# Patient Record
Sex: Male | Born: 1995
Health system: Southern US, Community
[De-identification: ages and names within clinical notes are randomized; demographics above are authoritative.]

## PROBLEM LIST (undated history)

## (undated) DIAGNOSIS — Z8489 Family history of other specified conditions: Secondary | ICD-10-CM

## (undated) DIAGNOSIS — I10 Essential (primary) hypertension: Secondary | ICD-10-CM

## (undated) HISTORY — PX: TONSILLECTOMY: SUR1361

## (undated) HISTORY — DX: Essential (primary) hypertension: I10

---
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
## 2017-08-20 ENCOUNTER — Encounter: Payer: Self-pay | Admitting: Family Medicine

## 2017-08-20 ENCOUNTER — Ambulatory Visit (INDEPENDENT_AMBULATORY_CARE_PROVIDER_SITE_OTHER): Payer: BLUE CROSS/BLUE SHIELD | Admitting: Family Medicine

## 2017-08-20 VITALS — BP 142/78 | HR 84 | Temp 98.4°F | Ht 71.0 in | Wt 247.6 lb

## 2017-08-20 DIAGNOSIS — I10 Essential (primary) hypertension: Secondary | ICD-10-CM

## 2017-08-20 DIAGNOSIS — Z13220 Encounter for screening for lipoid disorders: Secondary | ICD-10-CM

## 2017-08-20 DIAGNOSIS — Z0000 Encounter for general adult medical examination without abnormal findings: Secondary | ICD-10-CM | POA: Diagnosis not present

## 2017-08-20 LAB — LIPID PANEL
Cholesterol: 158 mg/dL (ref 0–200)
HDL: 31.2 mg/dL — ABNORMAL LOW (ref >39.00)
NonHDL: 126.76
Total CHOL/HDL Ratio: 5
Triglycerides: 313 mg/dL — ABNORMAL HIGH (ref 0.0–149.0)
VLDL: 62.6 mg/dL — ABNORMAL HIGH (ref 0.0–40.0)

## 2017-08-20 LAB — CBC WITH DIFFERENTIAL/PLATELET
Basophils Absolute: 0.2 10*3/uL — ABNORMAL HIGH (ref 0.0–0.1)
Basophils Relative: 2 % (ref 0.0–3.0)
Eosinophils Absolute: 0.5 10*3/uL (ref 0.0–0.7)
Eosinophils Relative: 5.8 % — ABNORMAL HIGH (ref 0.0–5.0)
HCT: 46.8 % (ref 39.0–52.0)
Hemoglobin: 15.9 g/dL (ref 13.0–17.0)
Lymphocytes Relative: 35.9 % (ref 12.0–46.0)
Lymphs Abs: 3.2 10*3/uL (ref 0.7–4.0)
MCHC: 34.1 g/dL (ref 30.0–36.0)
MCV: 91.3 fl (ref 78.0–100.0)
Monocytes Absolute: 0.7 10*3/uL (ref 0.1–1.0)
Monocytes Relative: 7.8 % (ref 3.0–12.0)
Neutro Abs: 4.3 10*3/uL (ref 1.4–7.7)
Neutrophils Relative %: 48.5 % (ref 43.0–77.0)
Platelets: 489 10*3/uL — ABNORMAL HIGH (ref 150.0–400.0)
RBC: 5.12 Mil/uL (ref 4.22–5.81)
RDW: 13.2 % (ref 11.5–14.6)
WBC: 8.9 10*3/uL (ref 4.5–10.5)

## 2017-08-20 LAB — COMPREHENSIVE METABOLIC PANEL
ALT: 40 U/L (ref 0–53)
AST: 25 U/L (ref 0–37)
Albumin: 4.8 g/dL (ref 3.5–5.2)
Alkaline Phosphatase: 81 U/L (ref 39–117)
BUN: 15 mg/dL (ref 6–23)
CO2: 28 mEq/L (ref 19–32)
Calcium: 10.4 mg/dL (ref 8.4–10.5)
Chloride: 103 mEq/L (ref 96–112)
Creatinine, Ser: 0.99 mg/dL (ref 0.40–1.50)
GFR: 102.43 mL/min (ref >60.00)
Glucose, Bld: 83 mg/dL (ref 70–99)
Potassium: 4.4 mEq/L (ref 3.5–5.1)
Sodium: 139 mEq/L (ref 135–145)
Total Bilirubin: 0.5 mg/dL (ref 0.2–1.2)
Total Protein: 7.1 g/dL (ref 6.0–8.3)

## 2017-08-20 LAB — LDL CHOLESTEROL, DIRECT: Direct LDL: 104 mg/dL

## 2017-08-20 LAB — HEMOGLOBIN A1C: Hgb A1c MFr Bld: 5.4 % (ref 4.6–6.5)

## 2017-08-20 LAB — TSH: TSH: 1.04 u[IU]/mL (ref 0.35–5.50)

## 2017-08-20 NOTE — Progress Notes (Signed)
Subjective:    Christian Villegas is a 22 y.o. male who presents today for his Complete Annual Exam.   No current outpatient medications on file.  Health Maintenance Due  Topic Date Due  . HIV Screening  08/18/2012  . INFLUENZA VACCINE  08/14/2017   PMHx, SurgHx, SocialHx, Medications, and Allergies were reviewed in the Visit Navigator and updated as appropriate.   Past Medical History:  Diagnosis Date  . Family history of adverse reaction to anesthesia    Father: Difficult to awaken.  . Hypertension      Past Surgical History:  Procedure Laterality Date  . OPEN REDUCTION INTERNAL FIXATION (ORIF) METACARPAL Left 08/12/2015   Procedure: OPEN REDUCTION INTERNAL FIXATION (ORIF) LEFT 5th METACARPAL PHALANGEAL FRACTURE;  Surgeon: Christian Kaufman, MD;  Location: [facility];  Service: Orthopedics;  Laterality: Left;  . TONSILLECTOMY       Family History  Problem Relation Age of Onset  . Hypertension Father   . Hypertension Paternal Uncle   . Asthma Maternal Grandmother   . Birth defects Maternal Grandmother   . Diabetes Maternal Grandmother   . Hyperlipidemia Maternal Grandmother   . Hypertension Maternal Grandmother   . Birth defects Maternal Grandfather   . Diabetes Maternal Grandfather   . Hyperlipidemia Maternal Grandfather   . Hypertension Maternal Grandfather   . Birth defects Paternal Grandmother   . Heart disease Paternal Grandfather   . Hypertension Paternal Grandfather     Social History   Tobacco Use  . Smoking status: Never Smoker  . Smokeless tobacco: Never Used  Substance Use Topics  . Alcohol use: Yes    Comment: Drinks rarely  . Drug use: No   Review of Systems:   Pertinent items are noted in the HPI. Otherwise, ROS is negative.  Objective:   Vitals:   08/20/17 0933  BP: (!) 142/78  Pulse: 84  Temp: 98.4 F (36.9 C)  SpO2: 98%   Body mass index is 34.53 kg/m.  General Appearance:  Alert, cooperative, no distress, appears stated age  Head:   Normocephalic, without obvious abnormality, atraumatic  Eyes:  PERRL, conjunctiva/corneas clear, EOM's intact, fundi benign, both eyes       Ears:  Normal TM's and external ear canals, both ears  Nose: Nares normal, septum midline, mucosa normal, no drainage    or sinus tenderness  Throat: Lips, mucosa, and tongue normal; teeth and gums normal  Neck: Supple, symmetrical, trachea midline, no adenopathy; thyroid:  No enlargement/tenderness/nodules; no carotit bruit or JVD  Back:   Symmetric, no curvature, ROM normal, no CVA tenderness  Lungs:   Clear to auscultation bilaterally, respirations unlabored  Chest wall:  No tenderness or deformity  Heart:  Regular rate and rhythm, S1 and S2 normal, no murmur, rub   or gallop  Abdomen:   Soft, non-tender, bowel sounds active all four quadrants, no masses, no organomegaly  Extremities: Extremities normal, atraumatic, no cyanosis or edema  Prostate: Not done.   Skin: Skin color, texture, turgor normal, no rashes or lesions  Lymph nodes: Cervical, supraclavicular, and axillary nodes normal  Neurologic: CNII-XII grossly intact. Normal strength, sensation and reflexes throughout   Assessment/Plan:   Christian Villegas was seen today for annual exam.  Diagnoses and all orders for this visit:  Routine physical examination  Morbid obesity ([name]) -     CBC with Differential/Platelet -     Comprehensive metabolic panel -     Hemoglobin A1c -     Lipid panel -  TSH  White coat syndrome with diagnosis of hypertension -     CBC with Differential/Platelet -     Comprehensive metabolic panel -     Hemoglobin A1c -     Lipid panel -     TSH  Screening for lipid disorders -     Lipid panel   Patient Counseling: [x]   Nutrition: Stressed importance of moderation in sodium/caffeine intake, saturated fat and cholesterol, caloric balance, sufficient intake of fresh fruits, vegetables, and fiber.  [x]   Stressed the importance of regular exercise.   []   Substance  Abuse: Discussed cessation/primary prevention of tobacco, alcohol, or other drug use; driving or other dangerous activities under the influence; availability of treatment for abuse.   [x]   Injury prevention: Discussed safety belts, safety helmets, smoke detector, smoking near bedding or upholstery.   []   Sexuality: Discussed sexually transmitted diseases, partner selection, use of condoms, avoidance of unintended pregnancy and contraceptive alternatives.   [x]   Dental health: Discussed importance of regular tooth brushing, flossing, and dental visits.  [x]   Health maintenance and immunizations reviewed. Please refer to Health maintenance section.    Christian Deutscher, DO [place]

## 2017-09-25 DIAGNOSIS — S83105A Unspecified dislocation of left knee, initial encounter: Secondary | ICD-10-CM | POA: Diagnosis not present

## 2017-09-25 DIAGNOSIS — Z87828 Personal history of other (healed) physical injury and trauma: Secondary | ICD-10-CM | POA: Diagnosis not present

## 2017-09-25 DIAGNOSIS — M25562 Pain in left knee: Secondary | ICD-10-CM | POA: Diagnosis not present

## 2017-09-25 DIAGNOSIS — S8992XA Unspecified injury of left lower leg, initial encounter: Secondary | ICD-10-CM | POA: Diagnosis not present

## 2017-09-25 DIAGNOSIS — X58XXXA Exposure to other specified factors, initial encounter: Secondary | ICD-10-CM | POA: Diagnosis not present

## 2017-09-25 DIAGNOSIS — M25462 Effusion, left knee: Secondary | ICD-10-CM | POA: Diagnosis not present

## 2017-09-26 DIAGNOSIS — S83015S Lateral dislocation of left patella, sequela: Secondary | ICD-10-CM | POA: Diagnosis not present

## 2017-09-30 DIAGNOSIS — M25462 Effusion, left knee: Secondary | ICD-10-CM | POA: Diagnosis not present

## 2017-10-03 DIAGNOSIS — S83015A Lateral dislocation of left patella, initial encounter: Secondary | ICD-10-CM | POA: Diagnosis not present

## 2017-10-24 DIAGNOSIS — S83015S Lateral dislocation of left patella, sequela: Secondary | ICD-10-CM | POA: Diagnosis not present

## 2017-12-10 DIAGNOSIS — S83015S Lateral dislocation of left patella, sequela: Secondary | ICD-10-CM | POA: Diagnosis not present

## 2017-12-10 DIAGNOSIS — S83015A Lateral dislocation of left patella, initial encounter: Secondary | ICD-10-CM | POA: Diagnosis not present

## 2017-12-14 HISTORY — PX: OTHER SURGICAL HISTORY: SHX169

## 2017-12-17 DIAGNOSIS — S83015S Lateral dislocation of left patella, sequela: Secondary | ICD-10-CM | POA: Diagnosis not present

## 2017-12-25 DIAGNOSIS — M2242 Chondromalacia patellae, left knee: Secondary | ICD-10-CM | POA: Diagnosis not present

## 2017-12-25 DIAGNOSIS — M65862 Other synovitis and tenosynovitis, left lower leg: Secondary | ICD-10-CM | POA: Diagnosis not present

## 2017-12-25 DIAGNOSIS — S838X2A Sprain of other specified parts of left knee, initial encounter: Secondary | ICD-10-CM | POA: Diagnosis not present

## 2017-12-25 DIAGNOSIS — S83015A Lateral dislocation of left patella, initial encounter: Secondary | ICD-10-CM | POA: Diagnosis not present

## 2017-12-25 DIAGNOSIS — T85698A Other mechanical complication of other specified internal prosthetic devices, implants and grafts, initial encounter: Secondary | ICD-10-CM | POA: Diagnosis not present

## 2017-12-25 DIAGNOSIS — M25362 Other instability, left knee: Secondary | ICD-10-CM | POA: Diagnosis not present

## 2017-12-25 DIAGNOSIS — M2202 Recurrent dislocation of patella, left knee: Secondary | ICD-10-CM | POA: Diagnosis not present

## 2017-12-25 DIAGNOSIS — Z9889 Other specified postprocedural states: Secondary | ICD-10-CM | POA: Diagnosis not present

## 2017-12-25 DIAGNOSIS — M6588 Other synovitis and tenosynovitis, other site: Secondary | ICD-10-CM | POA: Diagnosis not present

## 2018-01-02 DIAGNOSIS — S83015S Lateral dislocation of left patella, sequela: Secondary | ICD-10-CM | POA: Diagnosis not present

## 2018-01-16 DIAGNOSIS — S83015S Lateral dislocation of left patella, sequela: Secondary | ICD-10-CM | POA: Diagnosis not present

## 2018-01-16 DIAGNOSIS — S83015A Lateral dislocation of left patella, initial encounter: Secondary | ICD-10-CM | POA: Diagnosis not present

## 2018-02-06 DIAGNOSIS — S83015A Lateral dislocation of left patella, initial encounter: Secondary | ICD-10-CM | POA: Diagnosis not present

## 2018-02-06 DIAGNOSIS — S83015S Lateral dislocation of left patella, sequela: Secondary | ICD-10-CM | POA: Diagnosis not present

## 2018-08-04 NOTE — Progress Notes (Signed)
Christian Villegas is a 23 y.o. male is here for follow up.  History of Present Illness:   Hypertension This is a chronic problem. The current episode started more than 1 year ago. The problem has been waxing and waning since onset. The problem is uncontrolled. Pertinent negatives include no anxiety, blurred vision, chest pain, headaches, malaise/fatigue, palpitations, peripheral edema or shortness of breath. There are no associated agents to hypertension. Risk factors for coronary artery disease include male gender and family history. Past treatments include lifestyle changes. The current treatment provides mild improvement. Compliance problems include medication side effects (was on medication when he was younger but felt fatigued).  There is no history of kidney disease.   Health Maintenance Due  Topic Date Due  . HIV Screening  08/18/2012   Depression screen [facility] 2/9 08/05/2018 08/20/2017  Decreased Interest 0 0  Down, Depressed, Hopeless 0 0  PHQ - 2 Score 0 0  Altered sleeping 0 -  Tired, decreased energy 0 -  Change in appetite 0 -  Feeling bad or failure about yourself  0 -  Trouble concentrating 0 -  Moving slowly or fidgety/restless 0 -  Suicidal thoughts 0 -  PHQ-9 Score 0 -  Difficult doing work/chores Not difficult at all -   PMHx, SurgHx, SocialHx, FamHx, Medications, and Allergies were reviewed in the Visit Navigator and updated as appropriate.   Patient Active Problem List   Diagnosis Date Noted  . Recurrent dislocation of left patella 08/30/2016  . Chondromalacia patellae, left knee 08/30/2016   Social History   Tobacco Use  . Smoking status: Never Smoker  . Smokeless tobacco: Never Used  Substance Use Topics  . Alcohol use: Yes    Comment: Drinks rarely  . Drug use: No   Current Medications and Allergies   .  None  No Known Allergies   Review of Systems   Pertinent items are noted in the HPI. Otherwise, a complete ROS is negative.  Vitals   Vitals:     08/05/18 1602  BP: (!) 160/92  Pulse: 79  Temp: 98.8 F (37.1 C)  TempSrc: Oral  SpO2: 98%  Weight: 237 lb 4 oz (107.6 kg)  Height: 5\' 11"  (1.803 m)     Body mass index is 33.09 kg/m.  Physical Exam   Physical Exam Vitals signs and nursing note reviewed.  Constitutional:      General: He is not in acute distress.    Appearance: He is well-developed.  HENT:     Head: Normocephalic and atraumatic.     Right Ear: External ear normal.     Left Ear: External ear normal.     Nose: Nose normal.  Eyes:     Conjunctiva/sclera: Conjunctivae normal.     Pupils: Pupils are equal, round, and reactive to light.  Neck:     Musculoskeletal: Normal range of motion and neck supple.  Cardiovascular:     Rate and Rhythm: Normal rate and regular rhythm.     Heart sounds: Normal heart sounds.  Pulmonary:     Effort: Pulmonary effort is normal.     Breath sounds: Normal breath sounds.  Abdominal:     General: Bowel sounds are normal.     Palpations: Abdomen is soft.  Musculoskeletal: Normal range of motion.  Skin:    General: Skin is warm and dry.  Neurological:     Mental Status: He is alert and oriented to person, place, and time.  Psychiatric:  Behavior: Behavior normal.        Thought Content: Thought content normal.        Judgment: Judgment normal.    Results for orders placed or performed in visit on 08/20/17  CBC with Differential/Platelet  Result Value Ref Range   WBC 8.9 4.5 - 10.5 K/uL   RBC 5.12 4.22 - 5.81 Mil/uL   Hemoglobin 15.9 13.0 - 17.0 g/dL   HCT 46.8 39.0 - 52.0 %   MCV 91.3 78.0 - 100.0 fl   MCHC 34.1 30.0 - 36.0 g/dL   RDW 13.2 11.5 - 14.6 %   Platelets 489.0 (H) 150.0 - 400.0 K/uL   Neutrophils Relative % 48.5 43.0 - 77.0 %   Lymphocytes Relative 35.9 12.0 - 46.0 %   Monocytes Relative 7.8 3.0 - 12.0 %   Eosinophils Relative 5.8 (H) 0.0 - 5.0 %   Basophils Relative 2.0 0.0 - 3.0 %   Neutro Abs 4.3 1.4 - 7.7 K/uL   Lymphs Abs 3.2 0.7 - 4.0  K/uL   Monocytes Absolute 0.7 0.1 - 1.0 K/uL   Eosinophils Absolute 0.5 0.0 - 0.7 K/uL   Basophils Absolute 0.2 (H) 0.0 - 0.1 K/uL  Comprehensive metabolic panel  Result Value Ref Range   Sodium 139 135 - 145 mEq/L   Potassium 4.4 3.5 - 5.1 mEq/L   Chloride 103 96 - 112 mEq/L   CO2 28 19 - 32 mEq/L   Glucose, Bld 83 70 - 99 mg/dL   BUN 15 6 - 23 mg/dL   Creatinine, Ser 0.99 0.40 - 1.50 mg/dL   Total Bilirubin 0.5 0.2 - 1.2 mg/dL   Alkaline Phosphatase 81 39 - 117 U/L   AST 25 0 - 37 U/L   ALT 40 0 - 53 U/L   Total Protein 7.1 6.0 - 8.3 g/dL   Albumin 4.8 3.5 - 5.2 g/dL   Calcium 10.4 8.4 - 10.5 mg/dL   GFR 102.43 >60.00 mL/min  Hemoglobin A1c  Result Value Ref Range   Hgb A1c MFr Bld 5.4 4.6 - 6.5 %  Lipid panel  Result Value Ref Range   Cholesterol 158 0 - 200 mg/dL   Triglycerides 313.0 (H) 0.0 - 149.0 mg/dL   HDL 31.20 (L) >39.00 mg/dL   VLDL 62.6 (H) 0.0 - 40.0 mg/dL   Total CHOL/HDL Ratio 5    NonHDL 126.76   TSH  Result Value Ref Range   TSH 1.04 0.35 - 5.50 uIU/mL  LDL cholesterol, direct  Result Value Ref Range   Direct LDL 104.0 mg/dL   Assessment and Plan   [name] was seen today for hypertension.  Diagnoses and all orders for this visit:  Essential hypertension -     losartan (COZAAR) 50 MG tablet; Take 1 tablet (50 mg total) by mouth daily. -     Ambulatory referral to Sleep Studies -     CBC with Differential/Platelet -     TSH -     Magnesium  Sleep-disordered breathing -     Ambulatory referral to Sleep Studies  Mixed hyperlipidemia -     Comprehensive metabolic panel  Need for diphtheria-tetanus-pertussis (Tdap) vaccine -     Tdap vaccine greater than or equal to 7yo IM  Screening for HIV (human immunodeficiency virus) -     HIV Antibody (routine testing w rflx)   . Orders and follow up as documented in [place], reviewed diet, exercise and weight control, cardiovascular risk and specific lipid/LDL goals reviewed, reviewed  medications  and side effects in detail.  . Reviewed expectations re: course of current medical issues. . Outlined signs and symptoms indicating need for more acute intervention. . Patient verbalized understanding and all questions were answered. . Patient received an After Visit Summary.  Briscoe Deutscher, DO [place], [facility] 08/06/2018  Records requested if needed. Time spent: 25 minutes, of which >50% was spent in obtaining information about his symptoms, reviewing his previous labs, evaluations, and treatments, counseling him about his condition (please see the discussed topics above), and developing a plan to further investigate it; he had a number of questions which I addressed.

## 2018-08-05 ENCOUNTER — Ambulatory Visit (INDEPENDENT_AMBULATORY_CARE_PROVIDER_SITE_OTHER): Payer: BC Managed Care – PPO | Admitting: Family Medicine

## 2018-08-05 ENCOUNTER — Encounter: Payer: Self-pay | Admitting: Family Medicine

## 2018-08-05 ENCOUNTER — Other Ambulatory Visit: Payer: Self-pay

## 2018-08-05 VITALS — BP 160/92 | HR 79 | Temp 98.8°F | Ht 71.0 in | Wt 237.2 lb

## 2018-08-05 DIAGNOSIS — Z23 Encounter for immunization: Secondary | ICD-10-CM | POA: Diagnosis not present

## 2018-08-05 DIAGNOSIS — G4730 Sleep apnea, unspecified: Secondary | ICD-10-CM | POA: Diagnosis not present

## 2018-08-05 DIAGNOSIS — Z114 Encounter for screening for human immunodeficiency virus [HIV]: Secondary | ICD-10-CM

## 2018-08-05 DIAGNOSIS — E782 Mixed hyperlipidemia: Secondary | ICD-10-CM

## 2018-08-05 DIAGNOSIS — I10 Essential (primary) hypertension: Secondary | ICD-10-CM | POA: Diagnosis not present

## 2018-08-05 MED ORDER — LOSARTAN POTASSIUM 50 MG PO TABS: 50.0000 mg | ORAL_TABLET | Freq: Every day | ORAL | 3 refills | Status: DC

## 2018-08-06 LAB — COMPREHENSIVE METABOLIC PANEL
ALT: 24 U/L (ref 0–53)
AST: 17 U/L (ref 0–37)
Albumin: 5.2 g/dL (ref 3.5–5.2)
Alkaline Phosphatase: 65 U/L (ref 39–117)
BUN: 18 mg/dL (ref 6–23)
CO2: 29 mEq/L (ref 19–32)
Calcium: 10.5 mg/dL (ref 8.4–10.5)
Chloride: 101 mEq/L (ref 96–112)
Creatinine, Ser: 1.04 mg/dL (ref 0.40–1.50)
GFR: 90.18 mL/min (ref >60.00)
Glucose, Bld: 92 mg/dL (ref 70–99)
Potassium: 4.3 mEq/L (ref 3.5–5.1)
Sodium: 139 mEq/L (ref 135–145)
Total Bilirubin: 0.5 mg/dL (ref 0.2–1.2)
Total Protein: 7.4 g/dL (ref 6.0–8.3)

## 2018-08-06 LAB — TSH: TSH: 0.9 u[IU]/mL (ref 0.35–5.50)

## 2018-08-06 LAB — CBC WITH DIFFERENTIAL/PLATELET
Basophils Absolute: 0 10*3/uL (ref 0.0–0.1)
Basophils Relative: 0.5 % (ref 0.0–3.0)
Eosinophils Absolute: 0.6 10*3/uL (ref 0.0–0.7)
Eosinophils Relative: 6.8 % — ABNORMAL HIGH (ref 0.0–5.0)
HCT: 48.2 % (ref 39.0–52.0)
Hemoglobin: 15.9 g/dL (ref 13.0–17.0)
Lymphocytes Relative: 34.9 % (ref 12.0–46.0)
Lymphs Abs: 3 10*3/uL (ref 0.7–4.0)
MCHC: 33 g/dL (ref 30.0–36.0)
MCV: 92.6 fl (ref 78.0–100.0)
Monocytes Absolute: 0.5 10*3/uL (ref 0.1–1.0)
Monocytes Relative: 6.2 % (ref 3.0–12.0)
Neutro Abs: 4.4 10*3/uL (ref 1.4–7.7)
Neutrophils Relative %: 51.6 % (ref 43.0–77.0)
Platelets: 444 10*3/uL — ABNORMAL HIGH (ref 150.0–400.0)
RBC: 5.21 Mil/uL (ref 4.22–5.81)
RDW: 13 % (ref 11.5–14.6)
WBC: 8.6 10*3/uL (ref 4.5–10.5)

## 2018-08-06 LAB — HIV ANTIBODY (ROUTINE TESTING W REFLEX): HIV 1&2 Ab, 4th Generation: NONREACTIVE

## 2018-08-06 LAB — MAGNESIUM: Magnesium: 2 mg/dL (ref 1.5–2.5)

## 2018-08-13 ENCOUNTER — Ambulatory Visit (INDEPENDENT_AMBULATORY_CARE_PROVIDER_SITE_OTHER): Payer: BC Managed Care – PPO | Admitting: Neurology

## 2018-08-13 ENCOUNTER — Other Ambulatory Visit: Payer: Self-pay

## 2018-08-13 ENCOUNTER — Encounter: Payer: Self-pay | Admitting: Neurology

## 2018-08-13 VITALS — BP 140/73 | HR 70 | Temp 98.2°F | Ht 71.0 in | Wt 241.0 lb

## 2018-08-13 DIAGNOSIS — G4719 Other hypersomnia: Secondary | ICD-10-CM

## 2018-08-13 DIAGNOSIS — G478 Other sleep disorders: Secondary | ICD-10-CM

## 2018-08-13 DIAGNOSIS — I10 Essential (primary) hypertension: Secondary | ICD-10-CM

## 2018-08-13 DIAGNOSIS — R0683 Snoring: Secondary | ICD-10-CM

## 2018-08-13 NOTE — Progress Notes (Signed)
SLEEP MEDICINE CLINIC    Provider:  Larey Seat, MD  Primary Care Physician:  Briscoe Deutscher, [address] [place] [address]     Referring Provider: Briscoe Deutscher, [address] [place],  [place] [address]          Chief Complaint according to patient   Patient presents with:    . New Patient (Initial Visit)           HISTORY OF PRESENT ILLNESS:  Christian Villegas is a 22 y.o. year old right hand dominant  Single  Other or two or more races male patient seen here  on 08/13/2018 from Dr Juleen China for a sleep consultation.  Chief concern according to patient : " my Doc wonders if my high blood pressures could be related to sleep apnea. I was diagnosed with HTN in middle school."    I have the pleasure of seeing Christian Villegas today, a right-handed Other or two or more races male with a possible sleep disorder.  He  has a  has a past medical history of Family history of adverse reaction to anesthesia and Hypertension..  A renal US was reportedly negative for renal artery stenosis. His mother is not sure he ever had this [place] !    Sleep relevant medical history:  Nocturia/ Enuresis: none. No sleep walking, Night terrors- No other Parasomnia- Tonsillectomy at elementary school age , No trauma , ENT or cervical spine surgery. He denies allergic rhinitis, bronchitis, no asthma.     Family medical /sleep history: both grandparents on maternal side on CPAP with OSA,  2 maternal aunts. Parental grandparents have passed due to heart disease, CAD in their 63. Maternal grandparents living with high BP, cholesterol, cancer . MGF 72 and MGM 69.    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 His brother works there , too.   Patient is lives in a household with 6 persons, both parents , twin bothers, younger sister , baby brother. Family status is single.   Pets are not present. Tobacco use: none .  ETOH use; socially  - not now,  Caffeine intake in form of Coffee(  None ) Soda( 1/ week ) Tea ( decaffeinated ) or energy drinks. Regular exercise in form of jogging, work out and [employer].    Hobbies :Lacrosse.    Sleep habits are as follows: The patient's dinner time is between 5-9 PM.  The patient goes to bed at 11 PM and he falls asleep easily , continues to sleep for 7-8  Hours.   The preferred sleep position is prone  with the support of 1 pillow.  Dreams are reportedly frequent/vivid. Not prone to night mares.  7  AM is the usual rise time. The patient wakes up with an alarm.  Bedroom is cool, but he sleeps with a Tv on.  Hereports not feeling refreshed or restored in AM, with symptoms such as dry mouth, morning headaches in some cases, and residual fatigue.  Naps are not taken .    Review of Systems: Out of a complete 14 system review, the patient complains of only the following symptoms, and all other reviewed systems are negative.:  Non restorative sleep, no naps, HTN , snoring   How likely are you to doze in the following situations: 0 = not likely, 1 = slight chance, 2 = moderate chance, 3 = high chance  Sitting and Reading? Watching Television? Sitting inactive in a public place (theater or meeting)? As a passenger in a car for an hour without a break? Lying down in the afternoon when circumstances permit? Sitting and talking to someone? Sitting quietly after lunch without alcohol? In a car, while stopped for a few minutes in traffic?   Total = 13/ 24 points   FSS endorsed at 29/ 63 points.   Social History   Socioeconomic History  . Marital status: Single    Spouse name: Not on file  . Number of children: Not on file  . Years of education: Not on file  . Highest education level: Not on file  Occupational History  . Not on file  Social Needs  . Financial resource strain: Not on file  . Food insecurity    Worry: Not on file    Inability: Not on file  . Transportation needs     Medical: Not on file    Non-medical: Not on file  Tobacco Use  . Smoking status: Never Smoker  . Smokeless tobacco: Never Used  Substance and Sexual Activity  . Alcohol use: Yes    Comment: Drinks rarely  . Drug use: No  . Sexual activity: Never  Lifestyle  . Physical activity    Days per week: Not on file    Minutes per session: Not on file  . Stress: Not on file  Relationships  . Social Herbalist on phone: Not on file    Gets together: Not on file    Attends religious service: Not on file    Active member of club or organization: Not on file    Attends meetings of clubs or organizations: Not on file    Relationship status: Not on file  Other Topics Concern  . Not on file  Social History Narrative  . Not on file    Family History  Problem Relation Age of Onset  . Hypertension Father   . Hypertension Paternal Uncle   . Asthma Maternal Grandmother   . Birth defects Maternal Grandmother   . Diabetes Maternal Grandmother   . Hyperlipidemia Maternal Grandmother   . Hypertension Maternal Grandmother   . Birth defects Maternal Grandfather   . Diabetes Maternal Grandfather   . Hyperlipidemia Maternal Grandfather   . Hypertension Maternal Grandfather   . Birth defects Paternal Grandmother   . Heart disease Paternal Grandfather   . Hypertension Paternal Grandfather     Past Medical History:  Diagnosis Date  . Family history of adverse reaction to anesthesia    Father: Difficult to awaken.  . Hypertension     Past Surgical History:  Procedure Laterality Date  . Left knee surgery  12/2017  . OPEN REDUCTION INTERNAL FIXATION (ORIF) METACARPAL Left 08/12/2015   Procedure: OPEN REDUCTION INTERNAL FIXATION (ORIF) LEFT 5th METACARPAL PHALANGEAL FRACTURE;  Surgeon: Roseanne Kaufman, MD;  Location: [facility];  Service: Orthopedics;  Laterality: Left;  . TONSILLECTOMY       Current Outpatient Medications on File Prior to Visit  Medication Sig Dispense Refill  .  losartan (COZAAR) 50 MG tablet Take 1 tablet (50 mg total) by mouth daily. 90 tablet 3   No current facility-administered medications on file prior to visit.     No Known Allergies  Physical exam:  Today's Vitals   08/13/18 1005  BP: 140/73  Pulse: 70  Temp: 98.2 F (36.8 C)  Weight: 241 lb (109.3 kg)  Height: 5\' 11"  (1.803  m)   Body mass index is 33.61 kg/m.   Wt Readings from Last 3 Encounters:  08/13/18 241 lb (109.3 kg)  08/05/18 237 lb 4 oz (107.6 kg)  08/20/17 247 lb 9.6 oz (112.3 kg)     Ht Readings from Last 3 Encounters:  08/13/18 5\' 11"  (1.803 m)  08/05/18 5\' 11"  (1.803 m)  08/20/17 5\' 11"  (1.803 m)      General: The patient is awake, alert and appears not in acute distress. The patient is well groomed. Head: Normocephalic, atraumatic. Neck is supple. Mallampati 5,  neck circumference:19 inches . Nasal airflow patent.  Retrognathia is not  seen.  Facial hair noted.  Dental status: intact  Cardiovascular:  Regular rate and cardiac rhythm by pulse,  without distended neck veins. Respiratory: Lungs are clear to auscultation.  Skin:  Without evidence of ankle edema, or rash. Trunk: The patient's posture is erect.   Neurologic exam : The patient is awake and alert, oriented to place and time.   Memory subjective described as intact.  Attention span & concentration ability appears normal.  Speech is fluent,  without  dysarthria, dysphonia or aphasia.  Mood and affect are appropriate.   Cranial nerves: no loss of smell or taste reported  Pupils are equal and briskly reactive to light. Funduscopic exam was deferred. .  Extraocular movements in vertical and horizontal planes were intact and without nystagmus. No Diplopia. Visual fields by finger perimetry are intact. Hearing was intact to soft voice and finger rubbing.  He reports noisy environments cause tinnitus on the right.  Facial sensation intact to fine touch.  Facial motor strength is symmetric and  tongue and uvula move midline.  Neck ROM : rotation, tilt and flexion extension were normal for age and shoulder shrug was symmetrical.    Motor exam:  Symmetric bulk, tone and ROM.   Normal tone without cog wheeling, symmetric grip strength .   Sensory:  Fine touch, pinprick and vibration were tested  and  normal.  Proprioception tested in the upper extremities was normal.   Coordination: Rapid alternating movements in the fingers/hands were of normal speed.  The Finger-to-nose maneuver was intact without evidence of ataxia, dysmetria or tremor.   Gait and station: Patient could rise unassisted from a seated position, walked without assistive device.  Stance is of normal width/ base and the patient turned with 3 steps.  Toe and heel walk were deferred.  Deep tendon reflexes: in the  upper and lower extremities are symmetric and intact. He had left  Knee (not arthroscopy) , full surgery. Anterior cruciate .  Babinski response was deferred .       After spending a total time of  30 minutes face to face and additional time for physical and neurologic examination, review of laboratory studies,  personal review of imaging studies, reports and results of other testing and review of referral information / records as far as provided in visit, I have established the following assessments:  Christian Villegas is a healthy 23 year old man with african [place] and caucasian ancestry and has HTN since middle school.  He is a thunderously loud snorer. He reports non restorative sleep even after more than 7 hours of TST.  I addressed the only sleep hygiene issue, the bedroom Tv habit. Marland Kitchen   1)  High grade Mallampati and thick neck, has a twin brother who snores loudly and a father, too.  2)  BMI 33 kg/m2 in a very  muscular young man.  3)  unexplained HTN.  4) EDS    My Plan is to proceed with:  1)  Prefer an at home HST or in lab study    I would like to thank Briscoe Deutscher, DO and Briscoe Deutscher, [address] [place],  [place] [address] for allowing me to meet with and to take care of this pleasant patient.   In short, Christian Villegas is presenting with non restorative sleep, high degree of daytime sleepiness , loud snoring and family history of OSA ,all can be attributed to OSA, and may contribute to his hypertension.    I plan to follow up either personally or through our NP within 2-3 month.    Electronically signed by: Larey Seat, MD 08/13/2018 10:22 AM  [facility] and [facility] Board certified by The [employer] of Sleep Medicine and Diplomate of the [employer] of Sleep Medicine. Board certified In Neurology through the [place], Fellow of the [employer] of Neurology. Medical Director of [facility].

## 2018-08-13 NOTE — Patient Instructions (Signed)

## 2018-08-18 ENCOUNTER — Ambulatory Visit (INDEPENDENT_AMBULATORY_CARE_PROVIDER_SITE_OTHER): Payer: BC Managed Care – PPO | Admitting: Neurology

## 2018-08-18 DIAGNOSIS — G4719 Other hypersomnia: Secondary | ICD-10-CM

## 2018-08-18 DIAGNOSIS — I10 Essential (primary) hypertension: Secondary | ICD-10-CM

## 2018-08-18 DIAGNOSIS — G4700 Insomnia, unspecified: Secondary | ICD-10-CM

## 2018-08-18 DIAGNOSIS — G4761 Periodic limb movement disorder: Secondary | ICD-10-CM

## 2018-08-18 DIAGNOSIS — R0683 Snoring: Secondary | ICD-10-CM

## 2018-08-18 DIAGNOSIS — G478 Other sleep disorders: Secondary | ICD-10-CM

## 2018-08-21 DIAGNOSIS — G4719 Other hypersomnia: Secondary | ICD-10-CM | POA: Insufficient documentation

## 2018-08-21 DIAGNOSIS — G478 Other sleep disorders: Secondary | ICD-10-CM | POA: Insufficient documentation

## 2018-08-21 DIAGNOSIS — R0683 Snoring: Secondary | ICD-10-CM | POA: Insufficient documentation

## 2018-08-21 NOTE — Addendum Note (Signed)
Addended by: Larey Seat on: 08/21/2018 01:15 PM   Modules accepted: Orders

## 2018-08-21 NOTE — Procedures (Signed)
PATIENT'S NAME:  Christian Villegas, Christian C. DOB:      1995/02/13      MR#:    [id number]     DATE OF RECORDING: 08/18/2018 REFERRING M.D.:  Briscoe Deutscher, DO Study Performed:   Baseline Polysomnogram HISTORY:  Christian Villegas is a 23 y.o. year old right hand dominant, single male patient seen in the presence of his mother on 08/13/2018  for a sleep consultation.  Chief concern according to patient: "My Doc wonders if my high blood pressures could be related to sleep apnea. I was diagnosed with HTN in middle school."  He has a medical history of early onset Hypertension. Sleep relevant medical history:  Tonsillectomy in elementary school age.  Family medical /sleep history: both grandparents on maternal side on CPAP with OSA, 2 maternal aunts. Parental grandparents have passed due to heart disease, CAD in their 30. Maternal grandparents living with high BP, cholesterol, cancer. MGF is 72 and MGM is 69.  Regular exercise in form of jogging, work out and [employer]. Hobbies Lacrosse.  Sleep habits are as follows: The patient's dinner time is between 5-9 PM.  The patient goes to bed at 11 PM and he falls asleep easily, continues to sleep for 7-8 hours.   Dreams are reportedly frequent/vivid. But he is not prone to night mares.  Bedroom is cool, but he sleeps with a TV on.  He reports not feeling refreshed or restored in AM, with symptoms such as dry mouth, morning headaches in some cases, and residual fatigue.   The patient endorsed the Epworth Sleepiness Scale at 13/24 points.   The patient's weight 241 pounds with a height of 71 (inches), resulting in a BMI of 33.6 kg/m2. The patient's neck circumference measured 19 inches.  CURRENT MEDICATIONS: Cozaar   PROCEDURE:  This is a multichannel digital polysomnogram utilizing the Somnostar 11.2 system.  Electrodes and sensors were applied and monitored per AASM Specifications.   EEG, EOG, Chin and Limb EMG, were sampled at 200 Hz.  ECG, Snore and Nasal Pressure, Thermal  Airflow, Respiratory Effort, CPAP Flow and Pressure, Oximetry was sampled at 50 Hz. Digital video and audio were recorded.      BASELINE STUDY: Lights Out was at 22:58 and Lights On at 05:06.  Total recording time (TRT) was 368.5 minutes, with a total sleep time (TST) of 87 minutes.   The patient's sleep latency was 61.5 minutes.  REM latency was 0 minutes.  The sleep efficiency was 23.6 %.     SLEEP ARCHITECTURE: WASO (Wake after sleep onset) was 117.5 minutes.  There were 4.5 minutes in Stage N1, 46.5 minutes Stage N2, 36 minutes Stage N3 and 0 minutes in Stage REM.  The percentage of Stage N1 was 5.2%, Stage N2 was 53.4%, Stage N3 was 41.4% and Stage R (REM sleep) was 0%.   RESPIRATORY ANALYSIS:  There were a total of 2 respiratory events:  0 apneas and 2 hypopneas with 0 respiratory event related arousals (RERAs).     The total APNEA/HYPOPNEA INDEX (AHI) was 1.4/hour.  0 events occurred in REM sleep and 4 events in NREM. The REM AHI was 0 /hour, versus a non-REM AHI of 1.4. The patient spent 62.5 minutes of total sleep time in the supine position and 25 minutes in non-supine. The supine AHI was 1.0 versus a non-supine (prone) AHI of 2.4.  OXYGEN SATURATION & C02:  The Wake baseline 02 saturation was 95%, with the lowest being 75%. Time spent below 89% saturation  equaled 4 minutes. The arousals were noted as: 10 were spontaneous, 4 were associated with PLMs, and only 2 were associated with respiratory events. The patient had a total of 5 Periodic Limb Movements.  The Periodic Limb Movement (PLM) index was 3.4 and the PLM Arousal index was 2.8/hour.  Audio and video analysis did show frequent movements, but no complex behaviors, phonations or vocalizations.  The sleep efficiency was extremely low, certainly not representative of his home  The patient took no bathroom breaks. Snoring was noted. EKG was in keeping with sinus rhythm (NSR), and heart rate varied greatly, following  oxygenation.  Post-study, the patient indicated that sleep was shorter than usual.    IMPRESSION: Insomnia- there were only 88 minutes of sleep recorded.  No evidence of OSA, of prolonged hypoxia or active dream stages. These are the most common conditions involved in hypertension.     1.  Periodic Limb Movement Disorder (PLMD)- Mr. Brock was very restless.  2.  Primary Snoring   RECOMMENDATIONS:  1. Advise repeat of a sleep study as a HST- 88 minutes of sleep were not diagnostic. There was no REM sleep recorded.  I certify that I have reviewed the entire raw data recording prior to the issuance of this report in accordance with the Standards of Accreditation of the American Academy of Sleep Medicine (AASM)    Larey Seat, MD   08-21-2018 Diplomat, American Board of Psychiatry and Neurology  Diplomat, American Board of [place] Director, [place] [facility] [employer]

## 2018-08-24 ENCOUNTER — Telehealth: Payer: Self-pay | Admitting: Neurology

## 2018-08-24 NOTE — Telephone Encounter (Signed)
-----   Message from Larey Seat, MD sent at 08/21/2018  1:15 PM EDT ----- IMPRESSION: Insomnia- there were only 88 minutes of sleep  recorded.  No evidence of OSA, of prolonged hypoxia or active dream stages.  These are the most common conditions involved in hypertension.    1. Periodic Limb Movement Disorder (PLMD)- Christian Villegas was very  restless.  2. Primary Snoring    RECOMMENDATIONS:   1. Advise repeat of a sleep study as a HST- 88 minutes of sleep  were not diagnostic. There was no REM sleep recorded.   I certify that I have reviewed the entire raw data recording  prior to the issuance of this report in accordance with the  Standards of Accreditation of the [place] [employer] of Sleep  Medicine (AASM)     Larey Seat, MD  08-21-2018

## 2018-08-24 NOTE — Telephone Encounter (Signed)
Called patient to discuss sleep study results. No answer at this time. LVM for the patient to call back.   

## 2018-08-25 NOTE — Telephone Encounter (Signed)
Pt called again and left this number to be called back on. [phone]

## 2018-08-25 NOTE — Telephone Encounter (Signed)
Pt returned call, please call back when available.

## 2018-08-25 NOTE — Telephone Encounter (Signed)
Left a message on the number provided by the patient as there was no answer.   **IF pt calls back and I am unable to take the call. Please advise that Dr Brett Fairy reviewed his sleep study. The patient had a sleep study completed in the lab and upon her reviewing the patient was unable to sleep and we were only able to capture 88 minutes total of sleep, which is not enough sleep to get a accurate results. Dr Dohmeier recommends attempting again for the patient and completing a home sleep test that way the test can be completed at home in his own bed. Our sleep lab is working on getting that approved for him and should be in contact with him to get him scheduled and will educate him on how to complete that.  We are sorry he was unable to get sleep here.

## 2018-08-25 NOTE — Telephone Encounter (Signed)
Attempted to call the pt back. No answer. LVM for pt to call back

## 2018-08-27 NOTE — Telephone Encounter (Signed)
Pt advised.

## 2018-09-08 ENCOUNTER — Ambulatory Visit: Payer: BC Managed Care – PPO | Admitting: Family Medicine

## 2018-09-08 NOTE — Progress Notes (Deleted)
Virtual Visit via Video   Due to the COVID-19 pandemic, this visit was completed with telemedicine (audio/video) technology to reduce patient and provider exposure as well as to preserve personal protective equipment.   I connected with Christian Villegas by a video enabled telemedicine application and verified that I am speaking with the correct person using two identifiers. Location patient: Home Location provider: [facility], Office Persons participating in the virtual visit: Steadman C Fonte, Ionna Avis, DO   I discussed the limitations of evaluation and management by telemedicine and the availability of in person appointments. The patient expressed understanding and agreed to proceed.  Care Team   Patient Care Team: Briscoe Deutscher, DO as PCP - General (Family Medicine)  Subjective:   HPI:   ROS   Patient Active Problem List   Diagnosis Date Noted  . Excessive daytime sleepiness 08/21/2018  . UARS (upper airway resistance syndrome) 08/21/2018  . Non-restorative sleep 08/21/2018  . Loud snoring 08/21/2018  . Recurrent dislocation of left patella 08/30/2016  . Chondromalacia patellae, left knee 08/30/2016    Social History   Tobacco Use  . Smoking status: Never Smoker  . Smokeless tobacco: Never Used  Substance Use Topics  . Alcohol use: Yes    Comment: Drinks rarely    Current Outpatient Medications:  .  losartan (COZAAR) 50 MG tablet, Take 1 tablet (50 mg total) by mouth daily., Disp: 90 tablet, Rfl: 3  No Known Allergies  Objective:   VITALS: Per patient if applicable, see vitals. GENERAL: Alert, appears well and in no acute distress. HEENT: Atraumatic, conjunctiva clear, no obvious abnormalities on inspection of external nose and ears. NECK: Normal movements of the head and neck. CARDIOPULMONARY: No increased WOB. Speaking in clear sentences. I:E ratio WNL.  MS: Moves all visible extremities without noticeable abnormality. PSYCH: Pleasant and  cooperative, well-groomed. Speech normal rate and rhythm. Affect is appropriate. Insight and judgement are appropriate. Attention is focused, linear, and appropriate.  NEURO: CN grossly intact. Oriented as arrived to appointment on time with no prompting. Moves both UE equally.  SKIN: No obvious lesions, wounds, erythema, or cyanosis noted on face or hands.  Depression screen [facility] 2/9 08/05/2018 08/20/2017  Decreased Interest 0 0  Down, Depressed, Hopeless 0 0  PHQ - 2 Score 0 0  Altered sleeping 0 -  Tired, decreased energy 0 -  Change in appetite 0 -  Feeling bad or failure about yourself  0 -  Trouble concentrating 0 -  Moving slowly or fidgety/restless 0 -  Suicidal thoughts 0 -  PHQ-9 Score 0 -  Difficult doing work/chores Not difficult at all -    Assessment and Plan:   There are no diagnoses linked to this encounter.  Marland Kitchen COVID-19 Education: The signs and symptoms of COVID-19 were discussed with the patient and how to seek care for testing if needed. The importance of social distancing was discussed today. . Reviewed expectations re: course of current medical issues. . Discussed self-management of symptoms. . Outlined signs and symptoms indicating need for more acute intervention. . Patient verbalized understanding and all questions were answered. Marland Kitchen Health Maintenance issues including appropriate healthy diet, exercise, and smoking avoidance were discussed with patient. . See orders for this visit as documented in the electronic medical record.  Briscoe Deutscher, DO  Records requested if needed. Time spent: *** minutes, of which >50% was spent in obtaining information about his symptoms, reviewing his previous labs, evaluations, and treatments, counseling him about his  condition (please see the discussed topics above), and developing a plan to further investigate it; he had a number of questions which I addressed.

## 2018-09-16 ENCOUNTER — Ambulatory Visit: Payer: BC Managed Care – PPO | Admitting: Neurology

## 2018-09-16 DIAGNOSIS — G478 Other sleep disorders: Secondary | ICD-10-CM

## 2018-09-16 DIAGNOSIS — G4733 Obstructive sleep apnea (adult) (pediatric): Secondary | ICD-10-CM

## 2018-09-16 DIAGNOSIS — G4700 Insomnia, unspecified: Secondary | ICD-10-CM

## 2018-09-16 DIAGNOSIS — R0683 Snoring: Secondary | ICD-10-CM

## 2018-09-16 DIAGNOSIS — G4761 Periodic limb movement disorder: Secondary | ICD-10-CM

## 2018-09-23 NOTE — Procedures (Signed)
  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 He is a right-handed bi- racial male with a medical history of : HTN, onset in middle school. Family history of OSA, multiple members on CPAP, and adverse reaction to anesthesia and Hypertension. He snores, reports vivid dreams, has sometimes headaches in AM.                   Summary & Diagnosis:    Mild OSA with only 6.7/h AHI overall, and in REM sleep accentuation to AHI of 18.9/h. The patient snores very loudly, also exacerbated by REM sleep (RDI). Normal heart rate variability was noted and no hypoxemia recorded. Snoring was loud in prone and in supine sleep!  Recommendations:     There is no positional dependency for this apnea, albeit mild. Strong REM dependence makes use of a dental device less efficacious. I recommend CPAP use.  I will order an auto-CPAP titration device, with a pressure window from 6-14 cm water and 2 cm EPR, heated humidity as needed and mask of patient's choice.   Electronically Signed: Larey Seat, MD            Sleep Summary  Oxygen Saturation Statistics   Start Study Time: End Study Time: Total Recording Time:  1:08:37 AM 8:58:03 AM      7 h, 49 min  Total Sleep Time % REM of Sleep Time:  6 h, 15 min 25.6    Mean: 95 Minimum: 90 Maximum: 99  Mean of Desaturations Nadirs (%):   92  Oxygen Desaturation. %: 4-9 10-20 >20 Total  Events Number Total  7 100.0  0 0.0  0 0.0  7 100.0  Oxygen Saturation: <90 <=88 <85 <80 <70  Duration (minutes): Sleep % 0.0 0.0 0.0 0.0 0.0 0.0 0.0 0.0 0.0 0.0     Respiratory Indices       Total Events REM NREM All Night  pRDI:  55  pAHI:  36 ODI:  7  pAHIc:  0  % CSR: 0.0 26.7 18.9 2.2 0.0 6.9 4.3 1.1 0.0 10.3 6.7 1.3 0.0       Pulse Rate Statistics during Sleep (BPM)      Mean: 71 Minimum: 49 Maximum:  105    Indices are calculated using technically valid sleep time of  5 h, 21 min. Central-Indices are calculated using technically valid sleep time of  5  h, 11 min. pRDI/pAHI are calculated using 02 desaturations ? 3%  Body Position Statistics  Position Supine Prone Right Left Non-Supine  Sleep (min) 133.5 19.5 157.0 65.5 242.0  Sleep % 35.6 5.2 41.8 17.4 64.4  pRDI 16.0 15.5 6.6 5.0 6.9  pAHI 12.5 7.7 2.2 5.0 3.3  ODI 3.0 0.0 0.4 0.0 0.3     Snoring Statistics Snoring Level (dB) >40 >50 >60 >70 >80 >Threshold (45)  Sleep (min) 50.5 4.9 2.8 0.8 0.0 7.1  Sleep % 13.4 1.3 0.7 0.2 0.0 1.9    Mean: 40 dB Sleep Stages Chart

## 2018-09-23 NOTE — Addendum Note (Signed)
Addended by: Larey Seat on: 09/23/2018 06:50 PM   Modules accepted: Orders

## 2018-09-24 ENCOUNTER — Telehealth: Payer: Self-pay | Admitting: Neurology

## 2018-09-24 NOTE — Telephone Encounter (Signed)
I called pt. I advised pt that Dr. Brett Fairy reviewed their sleep study results and found that pt has sleep apnea present. Dr. Brett Fairy recommends that pt auto CPAP. I reviewed PAP compliance expectations with the pt. Pt is agreeable to starting a CPAP. I advised pt that an order will be sent to a DME, [facility], and [facility] will call the pt within about one week after they file with the pt's insurance.  [facility] will show the pt how to use the machine, fit for masks, and troubleshoot the CPAP if needed. A follow up appt was made for insurance purposes with Ward Givens, NP on Nov 12,2020 at 10:30 am. Pt verbalized understanding to arrive 15 minutes early and bring their CPAP. A letter with all of this information in it will be mailed to the pt as a reminder. I verified with the pt that the address we have on file is correct. Pt verbalized understanding of results. Pt had no questions at this time but was encouraged to call back if questions arise. I have sent the order to [facility] and have received confirmation that they have received the order.

## 2018-09-24 NOTE — Telephone Encounter (Signed)
-----   Message from Larey Seat, MD sent at 09/23/2018  6:50 PM EDT ----- There was a question of PLMD, which could not be answered by a HST, but mild OSA was diagnosed. Summary & Diagnosis:   Mild OSA with only 6.7/h AHI overall, and in REM sleep  accentuation to AHI of 18.9/h. The patient snores very loudly,  also exacerbated by REM sleep (RDI). Normal heart rate  variability was noted and no hypoxemia recorded. Snoring was loud  in prone and in supine sleep!   Recommendations:    There is no positional dependency for this apnea, albeit mild.  Strong REM dependence makes use of a dental device less  efficacious. I recommend CPAP use.  I will order an auto-CPAP titration device, with a pressure  window from 6-14 cm water and 2 cm EPR, heated humidity as needed  and mask of patient's choice.   Electronically Signed: Larey Seat, MD

## 2018-11-04 DIAGNOSIS — G4733 Obstructive sleep apnea (adult) (pediatric): Secondary | ICD-10-CM | POA: Diagnosis not present

## 2018-11-10 ENCOUNTER — Encounter: Payer: Self-pay | Admitting: Family Medicine

## 2018-11-10 ENCOUNTER — Ambulatory Visit: Payer: BC Managed Care – PPO | Admitting: Family Medicine

## 2018-11-10 ENCOUNTER — Ambulatory Visit (INDEPENDENT_AMBULATORY_CARE_PROVIDER_SITE_OTHER): Payer: BC Managed Care – PPO

## 2018-11-10 ENCOUNTER — Other Ambulatory Visit: Payer: Self-pay

## 2018-11-10 VITALS — BP 140/78 | HR 80 | Temp 97.7°F | Ht 71.0 in | Wt 249.2 lb

## 2018-11-10 DIAGNOSIS — M79674 Pain in right toe(s): Secondary | ICD-10-CM

## 2018-11-10 DIAGNOSIS — Z23 Encounter for immunization: Secondary | ICD-10-CM | POA: Diagnosis not present

## 2018-11-10 NOTE — Patient Instructions (Signed)
It was very nice to see you today!  You have a very small fracture in your great toe.  This should heal up normally within the next couple of weeks.  Please keep your toes buddy taped and wear the rigid sole shoe as much as possible over the next few weeks.  If your symptoms worsen or do not improve within the next 2 weeks, please let me know.  Take care, Dr Jerline Pain  Please try these tips to maintain a healthy lifestyle:   Eat at least 3 REAL meals and 1-2 snacks per day.  Aim for no more than 5 hours between eating.  If you eat breakfast, please do so within one hour of getting up.    Obtain twice as many fruits/vegetables as protein or carbohydrate foods for both lunch and dinner. (Half of each meal should be fruits/vegetables, one quarter protein, and one quarter starchy carbs)   Cut down on sweet beverages. This includes juice, soda, and sweet tea.    Exercise at least 150 minutes every week.

## 2018-11-10 NOTE — Progress Notes (Signed)
   Chief Complaint:  Christian Villegas is a 23 y.o. male who presents today with a chief complaint of toe pain.   Assessment/Plan:  Toe pain Appears to have small nondisplaced fracture based on my read.  Will place in rigid sole shoe and buddy tape toes.  He can continue using Tylenol and ibuprofen as needed for pain.  Anticipate normal healing within the next 2 weeks.  Discussed reasons to return to care.  Follow-up as needed.  H    Subjective:  HPI:  Toe Pain  Started 3 days ago. Dropped an ammo box on his right great toe.  Immediately noticed pain and swelling.  Has had some trouble with walking.  Worse with certain motions.  No treatments tried.  No numbness.  No tingling.  No weakness.  No other obvious aggravating or alleviating factors.  ROS: Per HPI  PMH: He reports that he has never smoked. He has never used smokeless tobacco. He reports current alcohol use. He reports that he does not use drugs.      Objective:  Physical Exam: BP 140/78   Pulse 80   Temp 97.7 F (36.5 C)   Ht 5\' 11"  (1.803 m)   Wt 249 lb 4 oz (113.1 kg)   SpO2 98%   BMI 34.76 kg/m   Gen: NAD, resting comfortably MSK: -Right foot: Right great toe with edema and ecchymosis at interphalangeal joint.  Distal cap refill.  Limited flexion due to pain.     Algis Greenhouse. Jerline Pain, MD 11/10/2018 1:42 PM

## 2018-11-20 ENCOUNTER — Encounter: Payer: Self-pay | Admitting: Family Medicine

## 2018-11-24 ENCOUNTER — Encounter: Payer: Self-pay | Admitting: Neurology

## 2018-11-26 ENCOUNTER — Other Ambulatory Visit: Payer: Self-pay

## 2018-11-26 ENCOUNTER — Encounter: Payer: Self-pay | Admitting: Adult Health

## 2018-11-26 ENCOUNTER — Ambulatory Visit (INDEPENDENT_AMBULATORY_CARE_PROVIDER_SITE_OTHER): Payer: 59 | Admitting: Adult Health

## 2018-11-26 VITALS — BP 120/72 | HR 83 | Temp 97.8°F | Ht 71.0 in | Wt 249.6 lb

## 2018-11-26 DIAGNOSIS — Z9989 Dependence on other enabling machines and devices: Secondary | ICD-10-CM

## 2018-11-26 DIAGNOSIS — G4733 Obstructive sleep apnea (adult) (pediatric): Secondary | ICD-10-CM | POA: Diagnosis not present

## 2018-11-26 NOTE — Patient Instructions (Signed)

## 2018-11-26 NOTE — Progress Notes (Signed)
PATIENT: Christian Villegas DOB: 18-Mar-1995  REASON FOR VISIT: follow up HISTORY FROM: patient  HISTORY OF PRESENT ILLNESS: Today 11/26/18:  [name] is a 23 year old male with a history of obstructive sleep apnea on CPAP.  He returns today for follow-up.  His download indicates that he uses machine 25 out of 30 days for compliance of 83%.  He uses machine greater than 4 hours 17 days for compliance of 57%.  On average he uses his machine 4 hours and 47 minutes.  His residual AHI is 0.8 on 6-14 cmH2O with EPR 3.  His leak in the 95th percentile is 9.1 L/min.  He states that there are some nights that he takes the machine off.  HISTORY [name] a 23 y.o. year old right hand dominant  Single  Other or two or more races male patientseen here on 08/13/2018 from Dr Juleen China for a sleep consultation.  Chiefconcernaccording to patient : " my Doc wonders if my high blood pressures could be related to sleep apnea. I was diagnosed with HTN in middle school."   I have the pleasure of seeing [name] today,a right-handed Other or two or more races male with a possible sleep disorder.  He  has a  has a past medical history of Family history of adverse reaction to anesthesia and Hypertension..  A renal US was reportedly negative for renal artery stenosis. His mother is not sure he ever had this [place] !  Sleeprelevant medical history:  Nocturia/ Enuresis: none. No sleep walking, Night terrors- No other Parasomnia- Tonsillectomy at elementary school age , No trauma , ENT or cervical spine surgery. He denies allergic rhinitis, bronchitis, no asthma.   Familymedical /sleep history:both grandparents on maternal side on CPAP with OSA,  2 maternal aunts. Parental grandparents have passed due to heart disease, CAD in their 45. Maternal grandparents living with high BP, cholesterol, cancer . MGF 72 and MGM 69.   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 His brother works there , too.  Patient is lives in a household with 6 persons, both parents , twin bothers, younger sister , baby brother. Family status is single.   Pets are not present. Tobacco use: none . ETOH use; socially - not now,  Caffeine intake in form of Coffee(  None ) Soda( 1/ week ) Tea ( decaffeinated ) or energy drinks. Regular exercise in form of jogging, work out and [employer].    Hobbies :Lacrosse.    Sleep habits are as follows:The patient's dinner time is between 5-9 PM.  The patient goes to bed at 11 PM and he falls asleep easily , continues to sleep for 7-8  Hours.   The preferred sleep position is prone  with the support of 1 pillow.  Dreams are reportedly frequent/vivid. Not prone to night mares.  7  AM is the usual rise time. The patient wakes up with an alarm.  Bedroom is cool, but he sleeps with a Tv on.  Hereports not feeling refreshed or restored in AM, with symptoms such as dry mouth, morning headaches in some cases, and residual fatigue.  Naps are not taken .  REVIEW OF SYSTEMS: Out of a complete 14 system review of symptoms, the patient complains only of the following symptoms, and all other reviewed systems are negative.  Epworth sleepiness score 5 Fatigue severity score 30  ALLERGIES: No Known Allergies  HOME MEDICATIONS:  Outpatient Medications Prior to Visit  Medication Sig Dispense Refill  . losartan (COZAAR) 50 MG tablet Take 1 tablet (50 mg total) by mouth daily. 90 tablet 3   No facility-administered medications prior to visit.     PAST MEDICAL HISTORY: Past Medical History:  Diagnosis Date  . Family history of adverse reaction to anesthesia    Father: Difficult to awaken.  . Hypertension     PAST SURGICAL HISTORY: Past Surgical History:  Procedure Laterality Date  . Left knee surgery  12/2017  . OPEN REDUCTION INTERNAL FIXATION (ORIF) METACARPAL Left 08/12/2015   Procedure: OPEN REDUCTION  INTERNAL FIXATION (ORIF) LEFT 5th METACARPAL PHALANGEAL FRACTURE;  Surgeon: Roseanne Kaufman, MD;  Location: [facility];  Service: Orthopedics;  Laterality: Left;  . TONSILLECTOMY      FAMILY HISTORY: Family History  Problem Relation Age of Onset  . Hypertension Father   . Hypertension Paternal Uncle   . Asthma Maternal Grandmother   . Birth defects Maternal Grandmother   . Diabetes Maternal Grandmother   . Hyperlipidemia Maternal Grandmother   . Hypertension Maternal Grandmother   . Birth defects Maternal Grandfather   . Diabetes Maternal Grandfather   . Hyperlipidemia Maternal Grandfather   . Hypertension Maternal Grandfather   . Birth defects Paternal Grandmother   . Heart disease Paternal Grandfather   . Hypertension Paternal Grandfather     SOCIAL HISTORY: Social History   Socioeconomic History  . Marital status: Single    Spouse name: Not on file  . Number of children: Not on file  . Years of education: Not on file  . Highest education level: Not on file  Occupational History  . Not on file  Social Needs  . Financial resource strain: Not on file  . Food insecurity    Worry: Not on file    Inability: Not on file  . Transportation needs    Medical: Not on file    Non-medical: Not on file  Tobacco Use  . Smoking status: Never Smoker  . Smokeless tobacco: Never Used  Substance and Sexual Activity  . Alcohol use: Yes    Comment: Drinks rarely  . Drug use: No  . Sexual activity: Never  Lifestyle  . Physical activity    Days per week: Not on file    Minutes per session: Not on file  . Stress: Not on file  Relationships  . Social Herbalist on phone: Not on file    Gets together: Not on file    Attends religious service: Not on file    Active member of club or organization: Not on file    Attends meetings of clubs or organizations: Not on file    Relationship status: Not on file  . Intimate partner violence    Fear of current or ex partner: Not on file     Emotionally abused: Not on file    Physically abused: Not on file    Forced sexual activity: Not on file  Other Topics Concern  . Not on file  Social History Narrative  . Not on file      PHYSICAL EXAM  Vitals:   11/26/18 1124  BP: 120/72  Pulse: 83  Temp: 97.8 F (36.6 C)  Weight: 249 lb 9.6 oz (113.2 kg)  Height: 5\' 11"  (1.803 m)   Body mass index is 34.81 kg/m.  Generalized: Well developed, in no acute distress  Chest: Lungs clear to auscultation bilaterally  Neurological examination  Mentation:  Alert oriented to time, place, history taking. Follows all commands speech and language fluent Cranial nerve II-XII: Extraocular movements were full, visual field were full on confrontational test Head turning and shoulder shrug  were normal and symmetric. Motor: The motor testing reveals 5 over 5 strength of all 4 extremities. Good symmetric motor tone is noted throughout.  Sensory: Sensory testing is intact to soft touch on all 4 extremities. No evidence of extinction is noted.  Gait and station: Gait is normal.    DIAGNOSTIC DATA (LABS, IMAGING, TESTING) - I reviewed patient records, labs, notes, testing and imaging myself where available.  Lab Results  Component Value Date   WBC 8.6 08/05/2018   HGB 15.9 08/05/2018   HCT 48.2 08/05/2018   MCV 92.6 08/05/2018   PLT 444.0 (H) 08/05/2018      Component Value Date/Time   NA 139 08/05/2018 1622   K 4.3 08/05/2018 1622   CL 101 08/05/2018 1622   CO2 29 08/05/2018 1622   GLUCOSE 92 08/05/2018 1622   BUN 18 08/05/2018 1622   CREATININE 1.04 08/05/2018 1622   CALCIUM 10.5 08/05/2018 1622   PROT 7.4 08/05/2018 1622   ALBUMIN 5.2 08/05/2018 1622   AST 17 08/05/2018 1622   ALT 24 08/05/2018 1622   ALKPHOS 65 08/05/2018 1622   BILITOT 0.5 08/05/2018 1622   Lab Results  Component Value Date   CHOL 158 08/20/2017   HDL 31.20 (L) 08/20/2017   LDLDIRECT 104.0 08/20/2017   TRIG 313.0 (H) 08/20/2017   CHOLHDL 5  08/20/2017   Lab Results  Component Value Date   HGBA1C 5.4 08/20/2017   No results found for: VITAMINB12 Lab Results  Component Value Date   TSH 0.90 08/05/2018      ASSESSMENT AND PLAN 23 y.o. year old male  has a past medical history of Family history of adverse reaction to anesthesia and Hypertension. here with :  1. Obstructive sleep apnea on CPAP  The patient's CPAP download shows suboptimal compliance and good treatment of his apnea.  He is encouraged to continue using CPAP nightly and greater than 4 hours each night.  He is advised that if his symptoms worsen or he develops new symptoms he should let us know.  He will follow-up in 6 months or sooner if needed   I spent 15 minutes with the patient. 50% of this time was spent reviewing CPAP download   Ward Givens, MSN, NP-C 11/26/2018, 11:42 AM [facility] [address] [place], [place] [address] [phone]

## 2019-01-29 ENCOUNTER — Ambulatory Visit: Payer: Commercial Managed Care - PPO | Attending: Internal Medicine

## 2019-01-29 DIAGNOSIS — Z20822 Contact with and (suspected) exposure to covid-19: Secondary | ICD-10-CM

## 2019-01-30 LAB — NOVEL CORONAVIRUS, NAA: SARS-CoV-2, NAA: NOT DETECTED

## 2019-05-26 ENCOUNTER — Telehealth: Payer: Self-pay

## 2019-05-26 ENCOUNTER — Ambulatory Visit: Payer: BC Managed Care – PPO | Admitting: Adult Health

## 2019-05-26 NOTE — Telephone Encounter (Signed)
Patient no-showed today's appointment.

## 2019-05-27 ENCOUNTER — Encounter: Payer: Self-pay | Admitting: Adult Health

## 2019-05-31 ENCOUNTER — Other Ambulatory Visit: Payer: Self-pay

## 2019-05-31 ENCOUNTER — Encounter: Payer: Self-pay | Admitting: Family Medicine

## 2019-05-31 ENCOUNTER — Ambulatory Visit (INDEPENDENT_AMBULATORY_CARE_PROVIDER_SITE_OTHER): Payer: No Typology Code available for payment source | Admitting: Family Medicine

## 2019-05-31 VITALS — BP 170/90 | HR 95 | Temp 97.5°F | Ht 71.0 in | Wt 241.4 lb

## 2019-05-31 DIAGNOSIS — M79604 Pain in right leg: Secondary | ICD-10-CM

## 2019-05-31 DIAGNOSIS — M5489 Other dorsalgia: Secondary | ICD-10-CM | POA: Diagnosis not present

## 2019-05-31 DIAGNOSIS — I10 Essential (primary) hypertension: Secondary | ICD-10-CM

## 2019-05-31 DIAGNOSIS — M25562 Pain in left knee: Secondary | ICD-10-CM

## 2019-05-31 MED ORDER — DICLOFENAC SODIUM 75 MG PO TBEC: 75.0000 mg | DELAYED_RELEASE_TABLET | Freq: Two times a day (BID) | ORAL | 0 refills | Status: DC

## 2019-05-31 NOTE — Assessment & Plan Note (Signed)
Above goal in setting of acute pain.  Has previously been well controlled.  Continue losartan 50 mg daily.  Continue home monitoring goal 140/90 or lower.

## 2019-05-31 NOTE — Progress Notes (Signed)
   Christian Villegas is a 24 y.o. male who presents today for an office visit.  Assessment/Plan:  New/Acute Problems: Right Leg Pain Consistent with quad strain/injury.  Possibly could have ruptured quad as well.  Start diclofenac.  Discussed home exercises and handout was given.  We will also place referral to sports medicine  Mid back pain Likely musculoskeletal.  No red flags.  Should have some improvement with diclofenac as above.  Left knee pain May have early underlying arthritis related to previous injuries.  Should have some improved with diclofenac.  We will also place referral to sports medicine.  Chronic Problems Addressed Today: Essential hypertension Above goal in setting of acute pain.  Has previously been well controlled.  Continue losartan 50 mg daily.  Continue home monitoring goal 140/90 or lower.      Subjective:  HPI:  Patient injured right leg about a month ago.  Was playing across and felt a pulling/popping sensation in his right leg.  Since then he has had some weakness and difficulty with walking.  Symptoms are currently manageable though pain is much worse with activity.  He also has chronic left knee pain.  Has torn his patellar tendon in the past and has had a couple surgeries.  Pain is worse with sitting prolonged periods of time.  He is also had mid back pain for a few months.  Worse with certain motions.  No pain at rest.  No specific treatments tried.        Objective:  Physical Exam: BP (!) 170/90   Pulse 95   Temp (!) 97.5 F (36.4 C) (Temporal)   Ht 5\' 11"  (1.803 m)   Wt 241 lb 6.4 oz (109.5 kg)   SpO2 98%   BMI 33.67 kg/m   Gen: No acute distress, resting comfortably MSK:  -Back: No deformities.  Tender to patient along right upper lumbar paraspinal muscles. -Right leg: Right quad with slight edema.  Weakness with hip flexion noted.  Neurovascular intact distally -Left leg: No deformities.  Surgical scars present on left knee.  Crepitus  with active and passive range of motion.  No joint line tenderness.  Neurovascular intact distally. Neuro: Grossly normal, moves all extremities Psych: Normal affect and thought content      Ronette Hank M. Jerline Pain, MD 05/31/2019 3:07 PM

## 2019-05-31 NOTE — Patient Instructions (Signed)
It was very nice to see you today!  I am concerned that she may have a tear in your quad.  Will place referral for you to see the sports medicine doctor.  Please start the diclofenac twice daily for the next week or 2.  Take care, Dr Jerline Pain  Please try these tips to maintain a healthy lifestyle:   Eat at least 3 REAL meals and 1-2 snacks per day.  Aim for no more than 5 hours between eating.  If you eat breakfast, please do so within one hour of getting up.    Each meal should contain half fruits/vegetables, one quarter protein, and one quarter carbs (no bigger than a computer mouse)   Cut down on sweet beverages. This includes juice, soda, and sweet tea.     Drink at least 1 glass of water with each meal and aim for at least 8 glasses per day   Exercise at least 150 minutes every week.

## 2019-07-01 ENCOUNTER — Encounter: Payer: Self-pay | Admitting: Family Medicine

## 2019-07-01 NOTE — Telephone Encounter (Signed)
I have sent a my chart message back to the patient informing him to reach out to our billing department at [phone].

## 2019-07-08 ENCOUNTER — Encounter: Payer: Self-pay | Admitting: Family Medicine

## 2019-07-08 ENCOUNTER — Other Ambulatory Visit: Payer: Self-pay

## 2019-07-08 ENCOUNTER — Ambulatory Visit (INDEPENDENT_AMBULATORY_CARE_PROVIDER_SITE_OTHER): Payer: No Typology Code available for payment source | Admitting: Family Medicine

## 2019-07-08 VITALS — BP 160/80 | HR 51 | Temp 98.8°F | Ht 71.0 in | Wt 243.6 lb

## 2019-07-08 DIAGNOSIS — I10 Essential (primary) hypertension: Secondary | ICD-10-CM

## 2019-07-08 LAB — CBC WITH DIFFERENTIAL/PLATELET
Basophils Absolute: 0.1 10*3/uL (ref 0.0–0.1)
Basophils Relative: 0.5 % (ref 0.0–3.0)
Eosinophils Absolute: 0.1 10*3/uL (ref 0.0–0.7)
Eosinophils Relative: 1.1 % (ref 0.0–5.0)
HCT: 48.3 % (ref 39.0–52.0)
Hemoglobin: 16.1 g/dL (ref 13.0–17.0)
Lymphocytes Relative: 18.9 % (ref 12.0–46.0)
Lymphs Abs: 2.2 10*3/uL (ref 0.7–4.0)
MCHC: 33.4 g/dL (ref 30.0–36.0)
MCV: 91.5 fl (ref 78.0–100.0)
Monocytes Absolute: 0.7 10*3/uL (ref 0.1–1.0)
Monocytes Relative: 6.3 % (ref 3.0–12.0)
Neutro Abs: 8.5 10*3/uL — ABNORMAL HIGH (ref 1.4–7.7)
Neutrophils Relative %: 73.2 % (ref 43.0–77.0)
Platelets: 471 10*3/uL — ABNORMAL HIGH (ref 150.0–400.0)
RBC: 5.28 Mil/uL (ref 4.22–5.81)
RDW: 12.9 % (ref 11.5–15.5)
WBC: 11.6 10*3/uL — ABNORMAL HIGH (ref 4.0–10.5)

## 2019-07-08 LAB — COMPREHENSIVE METABOLIC PANEL
ALT: 30 U/L (ref 0–53)
AST: 15 U/L (ref 0–37)
Albumin: 5.2 g/dL (ref 3.5–5.2)
Alkaline Phosphatase: 67 U/L (ref 39–117)
BUN: 16 mg/dL (ref 6–23)
CO2: 28 mEq/L (ref 19–32)
Calcium: 10.8 mg/dL — ABNORMAL HIGH (ref 8.4–10.5)
Chloride: 100 mEq/L (ref 96–112)
Creatinine, Ser: 0.86 mg/dL (ref 0.40–1.50)
GFR: 111.32 mL/min (ref >60.00)
Glucose, Bld: 83 mg/dL (ref 70–99)
Potassium: 4.4 mEq/L (ref 3.5–5.1)
Sodium: 137 mEq/L (ref 135–145)
Total Bilirubin: 0.7 mg/dL (ref 0.2–1.2)
Total Protein: 7.6 g/dL (ref 6.0–8.3)

## 2019-07-08 LAB — MICROALBUMIN / CREATININE URINE RATIO
Creatinine,U: 155 mg/dL
Microalb Creat Ratio: 0.8 mg/g (ref 0.0–30.0)
Microalb, Ur: 1.3 mg/dL (ref 0.0–1.9)

## 2019-07-08 LAB — TSH: TSH: 0.61 u[IU]/mL (ref 0.35–4.50)

## 2019-07-08 MED ORDER — LOSARTAN POTASSIUM 100 MG PO TABS: 100.0000 mg | ORAL_TABLET | Freq: Every day | ORAL | 1 refills | Status: DC

## 2019-07-08 NOTE — Patient Instructions (Addendum)
Going to increase your losartan to 100mg /day.  Get a blood pressure cuff for arm and try to keep a log for me.  Follow up in 2 weeks and if we need to add on another medication we can.  Cut out salt or at least try to decrease salt and work on diet.   Bring log in 2 weeks. Let me know if any issues.   Labs today and ekg as well!  Nice to meet you!  Dr. Rogers Blocker

## 2019-07-08 NOTE — Progress Notes (Signed)
Patient: Christian Villegas MRN: [id number] DOB: 1996-01-04 PCP: No primary care provider on file.     Subjective:  Chief Complaint  Patient presents with  . Hypertension    HPI: The patient is a 24 y.o. male who presents today for Hypertension. He says that when he was at PT, his b/p reading was too high and he was sent in. He is currently on losartan 50mg  daily. He has a long history of HTN and was worked up by [employer]. Has had echo/renal ultrasound. Diagnosed with OSA and is currently using CPAP. His blood pressure was near goal back in October of 2020. He states he is normally really active, but his is knee injury he has been quite sedentary. He admits to a poor diet and likely has high salt intake. He takes his blood pressure medication daily. Denies any headaches, vision changes, chest pain, shortness of breath or leg swelling.   Reading high at PT today so they sent him here. He is in pain with his knee. Rated as a 6/10.   Review of Systems  Constitutional: Negative for chills, fatigue and fever.  HENT: Negative for dental problem, ear pain, hearing loss and trouble swallowing.   Eyes: Negative for visual disturbance.  Respiratory: Negative for cough, chest tightness, shortness of breath and wheezing.   Cardiovascular: Negative for chest pain, palpitations and leg swelling.  Gastrointestinal: Negative for abdominal pain, blood in stool, diarrhea and nausea.  Endocrine: Negative for cold intolerance, polydipsia, polyphagia and polyuria.  Genitourinary: Negative for dysuria and hematuria.  Musculoskeletal: Positive for arthralgias (knee pain).  Skin: Negative for rash.  Neurological: Negative for dizziness, light-headedness and headaches.  Psychiatric/Behavioral: Negative for dysphoric mood and sleep disturbance. The patient is not nervous/anxious.     Allergies Patient has No Known Allergies.  Past Medical History Patient  has a past medical history of Family history of adverse  reaction to anesthesia and Hypertension.  Surgical History Patient  has a past surgical history that includes Tonsillectomy; Open reduction internal fixation (orif) metacarpal (Left, 08/12/2015); and Left knee surgery (12/2017).  Family History Pateint's family history includes Asthma in his maternal grandmother; Birth defects in his maternal grandfather, maternal grandmother, and paternal grandmother; Diabetes in his maternal grandfather and maternal grandmother; Heart disease in his paternal grandfather; Hyperlipidemia in his maternal grandfather and maternal grandmother; Hypertension in his father, maternal grandfather, maternal grandmother, paternal grandfather, and paternal uncle.  Social History Patient  reports that he has never smoked. He has never used smokeless tobacco. He reports current alcohol use. He reports that he does not use drugs.    Objective: Vitals:   07/08/19 1305 07/08/19 1321  BP: (!) 190/100 (!) 160/80  Pulse: (!) 51   Temp: 98.8 F (37.1 C)   TempSrc: Temporal   SpO2: 99%   Weight: 243 lb 9.6 oz (110.5 kg)   Height: 5\' 11"  (1.803 m)     Body mass index is 33.98 kg/m.  Physical Exam Vitals reviewed.  Constitutional:      Appearance: Normal appearance. He is obese.  HENT:     Head: Normocephalic.  Eyes:     Extraocular Movements: Extraocular movements intact.     Pupils: Pupils are equal, round, and reactive to light.  Cardiovascular:     Rate and Rhythm: Normal rate and regular rhythm.     Heart sounds: Normal heart sounds.  Pulmonary:     Effort: Pulmonary effort is normal.     Breath sounds: Normal breath sounds.  Abdominal:     General: Bowel sounds are normal.     Palpations: Abdomen is soft.  Musculoskeletal:     Cervical back: Normal range of motion and neck supple.  Skin:    General: Skin is warm.     Capillary Refill: Capillary refill takes less than 2 seconds.  Neurological:     General: No focal deficit present.     Mental Status:  He is alert and oriented to person, place, and time.  Psychiatric:        Mood and Affect: Mood normal.        Behavior: Behavior normal.    Ekg: NSR with rate of 65. A few peaked T waves, likely artifact. Labs being checked.     Assessment/plan: 1. Essential hypertension Increasing losartan to 100mg /day. Asked that he get an arm cuff and start keeping logs. Hasn't had labs in nearly a year, will check today. Needs ekg as well for baseline. See back in 2 weeks time. Precautions given.  - CBC with Differential/Platelet - Comprehensive metabolic panel - Microalbumin / creatinine urine ratio - TSH  Chart reviewed. In transition of establishing care. Will f/u with me in 2 weeks.    Return in about 2 weeks (around 07/22/2019) for blood pressure. Orma Flaming, MD [place]   07/08/2019

## 2019-07-09 ENCOUNTER — Encounter: Payer: Self-pay | Admitting: Family Medicine

## 2019-07-12 NOTE — Telephone Encounter (Signed)
See message below in regards to blood work.

## 2019-07-22 ENCOUNTER — Ambulatory Visit (INDEPENDENT_AMBULATORY_CARE_PROVIDER_SITE_OTHER): Payer: No Typology Code available for payment source | Admitting: Family Medicine

## 2019-07-22 ENCOUNTER — Other Ambulatory Visit: Payer: Self-pay

## 2019-07-22 ENCOUNTER — Encounter: Payer: Self-pay | Admitting: Family Medicine

## 2019-07-22 VITALS — BP 154/80 | HR 95 | Temp 98.9°F | Ht 71.0 in | Wt 245.6 lb

## 2019-07-22 DIAGNOSIS — I10 Essential (primary) hypertension: Secondary | ICD-10-CM | POA: Diagnosis not present

## 2019-07-22 DIAGNOSIS — D473 Essential (hemorrhagic) thrombocythemia: Secondary | ICD-10-CM | POA: Diagnosis not present

## 2019-07-22 DIAGNOSIS — D75839 Thrombocytosis, unspecified: Secondary | ICD-10-CM

## 2019-07-22 LAB — COMPREHENSIVE METABOLIC PANEL
ALT: 35 U/L (ref 0–53)
AST: 19 U/L (ref 0–37)
Albumin: 5.1 g/dL (ref 3.5–5.2)
Alkaline Phosphatase: 67 U/L (ref 39–117)
BUN: 11 mg/dL (ref 6–23)
CO2: 29 mEq/L (ref 19–32)
Calcium: 10.2 mg/dL (ref 8.4–10.5)
Chloride: 100 mEq/L (ref 96–112)
Creatinine, Ser: 1.01 mg/dL (ref 0.40–1.50)
GFR: 92.44 mL/min (ref >60.00)
Glucose, Bld: 95 mg/dL (ref 70–99)
Potassium: 4.5 mEq/L (ref 3.5–5.1)
Sodium: 137 mEq/L (ref 135–145)
Total Bilirubin: 0.7 mg/dL (ref 0.2–1.2)
Total Protein: 7.3 g/dL (ref 6.0–8.3)

## 2019-07-22 LAB — FERRITIN: Ferritin: 50.8 ng/mL (ref 22.0–322.0)

## 2019-07-22 MED ORDER — AMLODIPINE BESYLATE 5 MG PO TABS
5.0000 mg | ORAL_TABLET | Freq: Every day | ORAL | 3 refills | Status: DC
Start: 2019-07-22 — End: 2020-01-26

## 2019-07-22 NOTE — Patient Instructions (Addendum)
1) doing more labs for elevated platelets. Have had this for a year. Ask your mom and dad if there is any family history of elevated platelets in family   2) repeating calcium  3) blood pressure better, but still above goal. Sending in another medication called amlodipine for you to take daily. Will start at 5mg . Your bottom number is great today, so hopefully you will do well with just this 5mg  dosage. If blood pressure is consistently >140/90 you can increase to 10mg  or just wait until I see you again in 3 months.   Have a great weekend! Dr. Rogers Blocker

## 2019-07-22 NOTE — Progress Notes (Signed)
Patient: Christian Villegas MRN: [id number] DOB: [birth year] PCP: No primary care provider on file.     Subjective:  Chief Complaint  Patient presents with  . Hypertension    2 week f/u    HPI: The patient is a 24 y.o. male who presents today for Hypertension. I increased his losartan to 100mg /day at his last visit. His home readings are still quite elevated. Range of 145-168/90-110. No issues with medication increase. He is working on weight loss and trying to exercise. He is still in PT. Remains asymptomatic.   Was going to repeat labs today as well. History of high platelets x 1 year. He does not smoke, drinks water.   Review of Systems  Cardiovascular: Negative for chest pain, palpitations and leg swelling.  Genitourinary: Negative for frequency and urgency.  Skin: Negative for color change and rash.  Neurological: Negative for dizziness, light-headedness and headaches.    Allergies Patient has No Known Allergies.  Past Medical History Patient  has a past medical history of Family history of adverse reaction to anesthesia and Hypertension.  Surgical History Patient  has a past surgical history that includes Tonsillectomy; Open reduction internal fixation (orif) metacarpal (Left, 08/12/2015); and Left knee surgery (12/2017).  Family History Pateint's family history includes Asthma in his maternal grandmother; Birth defects in his maternal grandfather, maternal grandmother, and paternal grandmother; Diabetes in his maternal grandfather and maternal grandmother; Heart disease in his paternal grandfather; Hyperlipidemia in his maternal grandfather and maternal grandmother; Hypertension in his father, maternal grandfather, maternal grandmother, paternal grandfather, and paternal uncle.  Social History Patient  reports that he has never smoked. He has never used smokeless tobacco. He reports current alcohol use. He reports that he does not use drugs.    Objective: Vitals:   07/22/19  1417  BP: (!) 142/78  Pulse: 95  Temp: 98.9 F (37.2 C)  TempSrc: Temporal  SpO2: 99%  Weight: 245 lb 9.6 oz (111.4 kg)  Height: 5\' 11"  (1.803 m)    Body mass index is 34.25 kg/m.  Physical Exam Vitals reviewed.  Constitutional:      Appearance: Normal appearance.  HENT:     Head: Normocephalic and atraumatic.  Cardiovascular:     Rate and Rhythm: Normal rate and regular rhythm.     Heart sounds: Normal heart sounds.  Pulmonary:     Effort: Pulmonary effort is normal.     Breath sounds: Normal breath sounds.  Abdominal:     General: Abdomen is flat. Bowel sounds are normal.     Palpations: Abdomen is soft.  Neurological:     General: No focal deficit present.     Mental Status: He is alert and oriented to person, place, and time.  Psychiatric:        Mood and Affect: Mood normal.        Behavior: Behavior normal.        Assessment/plan: 1. Essential hypertension Still above goal. Continue losartan 100mg /day and will start him on amlodipine 5mg /day. Continue with log. Side effects discussed. If persists >140/90 for 2 weeks may increase to 10mg . F/u with me in 3 months time.   2. Thrombocytosis ([place]) Checking labs/smear/ferritin. Asked that he ask him parents about any family history of elevated platelets. Discussed referral to hematology if persists on this lab draw.  - Ferritin - Pathologist smear review  3. Hypercalcemia  - Comprehensive metabolic panel  This visit occurred during the SARS-CoV-2 public health emergency.  Safety protocols were in place,  including screening questions prior to the visit, additional usage of staff PPE, and extensive cleaning of exam room while observing appropriate contact time as indicated for disinfecting solutions.     Return in about 3 months (around 10/22/2019) for blood pressure .    Orma Flaming, MD [place]   07/22/2019

## 2019-07-23 LAB — CBC WITH DIFFERENTIAL/PLATELET
Absolute Monocytes: 707 cells/uL (ref 200–950)
Basophils Absolute: 93 cells/uL (ref 0–200)
Basophils Relative: 1 %
Eosinophils Absolute: 307 cells/uL (ref 15–500)
Eosinophils Relative: 3.3 %
HCT: 47.5 % (ref 38.5–50.0)
Hemoglobin: 16.2 g/dL (ref 13.2–17.1)
Lymphs Abs: 2353 cells/uL (ref 850–3900)
MCH: 30.9 pg (ref 27.0–33.0)
MCHC: 34.1 g/dL (ref 32.0–36.0)
MCV: 90.6 fL (ref 80.0–100.0)
MPV: 9.6 fL (ref 7.5–12.5)
Monocytes Relative: 7.6 %
Neutro Abs: 5840 cells/uL (ref 1500–7800)
Neutrophils Relative %: 62.8 %
Platelets: 504 10*3/uL — ABNORMAL HIGH (ref 140–400)
RBC: 5.24 10*6/uL (ref 4.20–5.80)
RDW: 12.6 % (ref 11.0–15.0)
Total Lymphocyte: 25.3 %
WBC: 9.3 10*3/uL (ref 3.8–10.8)

## 2019-07-23 LAB — PATHOLOGIST SMEAR REVIEW

## 2019-07-23 NOTE — Progress Notes (Signed)
CBC sent via [facility] portal

## 2019-07-23 NOTE — Addendum Note (Signed)
Addended by: Orma Flaming on: 07/23/2019 08:10 AM   Modules accepted: Orders

## 2019-07-28 ENCOUNTER — Other Ambulatory Visit: Payer: Self-pay | Admitting: Family Medicine

## 2019-07-28 DIAGNOSIS — D75839 Thrombocytosis, unspecified: Secondary | ICD-10-CM | POA: Insufficient documentation

## 2019-07-29 ENCOUNTER — Telehealth: Payer: Self-pay | Admitting: Hematology and Oncology

## 2019-07-29 NOTE — Telephone Encounter (Signed)
Received a new hem referral from Dr. Rogers Blocker for thrombocytosis. [name] has been cld and scheduled to see Dr. Lorenso Courier on 7/29 at 2pm. Pt aware to arrive 15 minutes early.

## 2019-08-11 NOTE — Progress Notes (Signed)
[facility] [phone]   [phone]  INITIAL CONSULT NOTE  No care team member to display  Hematological/Oncological History # Thrombocytosis 1) 08/20/2017: WBC 8.9, Hgb 15.9, MCV 91.3, Plt 489 2) 08/05/2018: WBC 8.6, Hgb 15.9, MCV 92.6, Plt 444 3) 07/08/2019: WBC 11.6, Hgb 16.1, MCV 91.5, Plt 471 4) 07/22/2019: WBC 9.3, Hgb 16.2, MCV 90.6, Plt 504 5) 08/12/2019: establish care with Dr. Leonides Schanz   CHIEF COMPLAINTS/PURPOSE OF CONSULTATION:  "Thrombocytosis "  HISTORY OF PRESENTING ILLNESS:  Christian Villegas 24 y.o. male with medical history significant for hypertension and obstructive sleep apnea who presents for evaluation of longstanding thrombocytosis.  On review of the previous records Christian Villegas has had elevated platelet count since at least 08/20/2017.  At that time he was noted to have a platelet count of 489 with a MCV of 91.3 and a hemoglobin of 15.9.  In 2020 as well as 2021 the patient has always had a platelet count elevated above the normal reference range.  Most recently on 07/22/2019 the patient had a platelet of 504, hemoglobin of 16.2, and MCV of 90.6.  Interestingly he is only had this elevation in platelets and has had no polycythemia and only one episode of leukocytosis.  Due to concern for this longstanding thrombocytosis the patient was referred to hematology for further evaluation and management  On exam today Christian Villegas notes that he feels quite well.  He reports that he does have arthritis of his left knee and has had numerous surgical interventions on that knee starting in 2017.  He reports that the injuries began when he was playing lacrosse in college.  On further discussion he notes that he has no inflammatory conditions and denies having other issues with joint pain, rash, or unexplained fevers, chills, sweats.  He notes that he is a never smoker and only occasionally drinks alcohol.  He has no family history remarkable for hematological or  rheumatological conditions noting that only hypertension runs in his family.    On further discussion he notes that he eats a regular diet and has no restrictions.  He eats red meat, chicken, fish, and greens.  He notes that he has not yet received the Covid vaccine.  Otherwise he had no questions complaints or concerns today.  A full 10 point ROS is listed below.  MEDICAL HISTORY:  Past Medical History:  Diagnosis Date  . Family history of adverse reaction to anesthesia    Father: Difficult to awaken.  . Hypertension     SURGICAL HISTORY: Past Surgical History:  Procedure Laterality Date  . Left knee surgery  12/2017  . OPEN REDUCTION INTERNAL FIXATION (ORIF) METACARPAL Left 08/12/2015   Procedure: OPEN REDUCTION INTERNAL FIXATION (ORIF) LEFT 5th METACARPAL PHALANGEAL FRACTURE;  Surgeon: Dominica Severin, MD;  Location: [facility] OR;  Service: Orthopedics;  Laterality: Left;  . TONSILLECTOMY      SOCIAL HISTORY: Social History   Socioeconomic History  . Marital status: Single    Spouse name: Not on file  . Number of children: Not on file  . Years of education: Not on file  . Highest education level: Not on file  Occupational History  . Not on file  Tobacco Use  . Smoking status: Never Smoker  . Smokeless tobacco: Never Used  Vaping Use  . Vaping Use: Never used  Substance and Sexual Activity  . Alcohol use: Yes    Comment: Drinks rarely  . Drug use: No  . Sexual activity: Never  Other Topics Concern  . Not on file  Social History Narrative  . Not on file   Social Determinants of Health   Financial Resource Strain:   . Difficulty of Paying Living Expenses:   Food Insecurity:   . Worried About Programme researcher, broadcasting/film/video in the Last Year:   . Barista in the Last Year:   Transportation Needs:   . Freight forwarder (Medical):   Marland Kitchen Lack of Transportation (Non-Medical):   Physical Activity:   . Days of Exercise per Week:   . Minutes of Exercise per Session:   Stress:    . Feeling of Stress :   Social Connections:   . Frequency of Communication with Friends and Family:   . Frequency of Social Gatherings with Friends and Family:   . Attends Religious Services:   . Active Member of Clubs or Organizations:   . Attends Banker Meetings:   Marland Kitchen Marital Status:   Intimate Partner Violence:   . Fear of Current or Ex-Partner:   . Emotionally Abused:   Marland Kitchen Physically Abused:   . Sexually Abused:     FAMILY HISTORY: Family History  Problem Relation Age of Onset  . Hypertension Father   . Hypertension Paternal Uncle   . Asthma Maternal Grandmother   . Birth defects Maternal Grandmother   . Diabetes Maternal Grandmother   . Hyperlipidemia Maternal Grandmother   . Hypertension Maternal Grandmother   . Birth defects Maternal Grandfather   . Diabetes Maternal Grandfather   . Hyperlipidemia Maternal Grandfather   . Hypertension Maternal Grandfather   . Birth defects Paternal Grandmother   . Heart disease Paternal Grandfather   . Hypertension Paternal Grandfather     ALLERGIES:  has No Known Allergies.  MEDICATIONS:  Current Outpatient Medications  Medication Sig Dispense Refill  . amLODipine (NORVASC) 5 MG tablet Take 1 tablet (5 mg total) by mouth daily. 90 tablet 3  . diclofenac (VOLTAREN) 75 MG EC tablet Take 1 tablet (75 mg total) by mouth 2 (two) times daily. 30 tablet 0  . losartan (COZAAR) 100 MG tablet Take 1 tablet (100 mg total) by mouth daily. 90 tablet 1   No current facility-administered medications for this visit.    REVIEW OF SYSTEMS:   Constitutional: ( - ) fevers, ( - )  chills , ( - ) night sweats Eyes: ( - ) blurriness of vision, ( - ) double vision, ( - ) watery eyes Ears, nose, mouth, throat, and face: ( - ) mucositis, ( - ) sore throat Respiratory: ( - ) cough, ( - ) dyspnea, ( - ) wheezes Cardiovascular: ( - ) palpitation, ( - ) chest discomfort, ( - ) lower extremity swelling Gastrointestinal:  ( - ) nausea, ( - )  heartburn, ( - ) change in bowel habits Skin: ( - ) abnormal skin rashes Lymphatics: ( - ) new lymphadenopathy, ( - ) easy bruising Neurological: ( - ) numbness, ( - ) tingling, ( - ) new weaknesses Behavioral/Psych: ( - ) mood change, ( - ) new changes  All other systems were reviewed with the patient and are negative.  PHYSICAL EXAMINATION: ECOG PERFORMANCE STATUS: 0 - Asymptomatic  There were no vitals filed for this visit. There were no vitals filed for this visit.  GENERAL: well appearing young male in NAD  SKIN: skin color, texture, turgor are normal, no rashes or significant lesions EYES: conjunctiva are pink and non-injected, sclera clear LUNGS: clear  to auscultation and percussion with normal breathing effort HEART: regular rate & rhythm and no murmurs and no lower extremity edema ABDOMEN: soft, non-tender, non-distended, normal bowel sounds. No HSM.  Musculoskeletal: no cyanosis of digits and no clubbing  PSYCH: alert & oriented x 3, fluent speech NEURO: no focal motor/sensory deficits  LABORATORY DATA:  I have reviewed the data as listed CBC Latest Ref Rng & Units 07/22/2019 07/08/2019 08/05/2018  WBC 3.8 - 10.8 Thousand/uL 9.3 11.6(H) 8.6  Hemoglobin 13.2 - 17.1 g/dL 16.2 16.1 15.9  Hematocrit 38 - 50 % 47.5 48.3 48.2  Platelets 140 - 400 Thousand/uL 504(H) 471.0(H) 444.0(H)    CMP Latest Ref Rng & Units 07/22/2019 07/08/2019 08/05/2018  Glucose 70 - 99 mg/dL 95 83 92  BUN 6 - 23 mg/dL '11 16 18  '$ Creatinine 0.40 - 1.50 mg/dL 1.01 0.86 1.04  Sodium 135 - 145 mEq/L 137 137 139  Potassium 3.5 - 5.1 mEq/L 4.5 4.4 4.3  Chloride 96 - 112 mEq/L 100 100 101  CO2 19 - 32 mEq/L '29 28 29  '$ Calcium 8.4 - 10.5 mg/dL 10.2 10.8(H) 10.5  Total Protein 6.0 - 8.3 g/dL 7.3 7.6 7.4  Total Bilirubin 0.2 - 1.2 mg/dL 0.7 0.7 0.5  Alkaline Phos 39 - 117 U/L 67 67 65  AST 0 - 37 U/L '19 15 17  '$ ALT 0 - 53 U/L 35 30 24    RADIOGRAPHIC STUDIES: No results found.  ASSESSMENT & PLAN Christian C  Villegas 24 y.o. male with medical history significant for hypertension and obstructive sleep apnea who presents for evaluation of longstanding thrombocytosis.  After review the labs, review the records, discussion with the patient the findings are most consistent with a thrombocytosis of unclear etiology.  There are 3 possible explanations for the patient's thrombocytosis.  The first would be iron deficiency anemia which is the most common cause of thrombocytosis.  The etiology of the iron deficiency anemia would not be clear with prompt further work-up.  The second would be chronic inflammation.  The patient does have chronic knee pain from arthritis but does not have any other signs or symptoms of an underlying rheumatological condition.  The third possibility is the patient has a myeloproliferative neoplasm therefore today we will order a JAK2 panel with reflex in order to effectively rule this out.  In the event we are not able to find a clear etiology today I would recommend that the patient return to clinic in 6 months time.  We find intervention is necessary we will have the patient return sooner.  # Thrombocytosis --today will order CBC, CMP, iron panel, reticulocyte panel, and ferritin --additionally will collect ESR and CRP to assess for underlying inflammation --MPN less likely but given the chronicity may have a driver mutation causing this thrombocytosis --RTC in 6 months or sooner if an etiology requiring intervention is found.   No orders of the defined types were placed in this encounter.   All questions were answered. The patient knows to call the clinic with any problems, questions or concerns.  A total of more than 60 minutes were spent on this encounter and over half of that time was spent on counseling and coordination of care as outlined above.   Ledell Peoples, MD Department of Hematology/Oncology [place] [facility] Phone: [phone] Pager:  [phone] Email: [email or web address]  08/11/2019 10:50 PM

## 2019-08-12 ENCOUNTER — Encounter: Payer: Self-pay | Admitting: Hematology and Oncology

## 2019-08-12 ENCOUNTER — Inpatient Hospital Stay: Payer: No Typology Code available for payment source

## 2019-08-12 ENCOUNTER — Other Ambulatory Visit: Payer: Self-pay

## 2019-08-12 ENCOUNTER — Inpatient Hospital Stay
Payer: No Typology Code available for payment source | Attending: Hematology and Oncology | Admitting: Hematology and Oncology

## 2019-08-12 VITALS — BP 162/97 | HR 105 | Temp 97.8°F | Resp 18 | Ht 71.0 in | Wt 238.3 lb

## 2019-08-12 DIAGNOSIS — Z833 Family history of diabetes mellitus: Secondary | ICD-10-CM | POA: Diagnosis not present

## 2019-08-12 DIAGNOSIS — Z8249 Family history of ischemic heart disease and other diseases of the circulatory system: Secondary | ICD-10-CM | POA: Diagnosis not present

## 2019-08-12 DIAGNOSIS — D75839 Thrombocytosis, unspecified: Secondary | ICD-10-CM

## 2019-08-12 DIAGNOSIS — R7989 Other specified abnormal findings of blood chemistry: Secondary | ICD-10-CM | POA: Diagnosis not present

## 2019-08-12 DIAGNOSIS — Z8349 Family history of other endocrine, nutritional and metabolic diseases: Secondary | ICD-10-CM | POA: Insufficient documentation

## 2019-08-12 DIAGNOSIS — M1712 Unilateral primary osteoarthritis, left knee: Secondary | ICD-10-CM | POA: Insufficient documentation

## 2019-08-12 LAB — CMP (CANCER CENTER ONLY)
ALT: 74 U/L — ABNORMAL HIGH (ref 0–44)
AST: 35 U/L (ref 15–41)
Albumin: 4.6 g/dL (ref 3.5–5.0)
Alkaline Phosphatase: 75 U/L (ref 38–126)
Anion gap: 10 (ref 5–15)
BUN: 14 mg/dL (ref 6–20)
CO2: 27 mmol/L (ref 22–32)
Calcium: 10.6 mg/dL — ABNORMAL HIGH (ref 8.9–10.3)
Chloride: 104 mmol/L (ref 98–111)
Creatinine: 1.14 mg/dL (ref 0.61–1.24)
GFR, Est AFR Am: >60 mL/min (ref >60)
GFR, Estimated: >60 mL/min (ref >60)
Glucose, Bld: 103 mg/dL — ABNORMAL HIGH (ref 70–99)
Potassium: 4 mmol/L (ref 3.5–5.1)
Sodium: 141 mmol/L (ref 135–145)
Total Bilirubin: 0.7 mg/dL (ref 0.3–1.2)
Total Protein: 8.3 g/dL — ABNORMAL HIGH (ref 6.5–8.1)

## 2019-08-12 LAB — CBC WITH DIFFERENTIAL (CANCER CENTER ONLY)
Abs Immature Granulocytes: 0.04 10*3/uL (ref 0.00–0.07)
Basophils Absolute: 0.1 10*3/uL (ref 0.0–0.1)
Basophils Relative: 1 %
Eosinophils Absolute: 0.2 10*3/uL (ref 0.0–0.5)
Eosinophils Relative: 3 %
HCT: 50 % (ref 39.0–52.0)
Hemoglobin: 16.7 g/dL (ref 13.0–17.0)
Immature Granulocytes: 1 %
Lymphocytes Relative: 33 %
Lymphs Abs: 2.6 10*3/uL (ref 0.7–4.0)
MCH: 30.9 pg (ref 26.0–34.0)
MCHC: 33.4 g/dL (ref 30.0–36.0)
MCV: 92.4 fL (ref 80.0–100.0)
Monocytes Absolute: 0.8 10*3/uL (ref 0.1–1.0)
Monocytes Relative: 10 %
Neutro Abs: 4.2 10*3/uL (ref 1.7–7.7)
Neutrophils Relative %: 52 %
Platelet Count: 470 10*3/uL — ABNORMAL HIGH (ref 150–400)
RBC: 5.41 MIL/uL (ref 4.22–5.81)
RDW: 12.5 % (ref 11.5–15.5)
WBC Count: 7.9 10*3/uL (ref 4.0–10.5)
nRBC: 0 % (ref 0.0–0.2)

## 2019-08-12 LAB — C-REACTIVE PROTEIN: CRP: 1.5 mg/dL — ABNORMAL HIGH (ref <1.0)

## 2019-08-12 LAB — LACTATE DEHYDROGENASE: LDH: 169 U/L (ref 98–192)

## 2019-08-12 LAB — SEDIMENTATION RATE: Sed Rate: 6 mm/hr (ref 0–16)

## 2019-08-13 LAB — IRON AND TIBC
Iron: 42 ug/dL (ref 42–163)
Saturation Ratios: 12 % — ABNORMAL LOW (ref 20–55)
TIBC: 341 ug/dL (ref 202–409)
UIBC: 300 ug/dL (ref 117–376)

## 2019-08-13 LAB — FERRITIN: Ferritin: 111 ng/mL (ref 24–336)

## 2019-08-17 ENCOUNTER — Telehealth: Payer: Self-pay | Admitting: Hematology and Oncology

## 2019-08-17 NOTE — Telephone Encounter (Signed)
Scheduled per los. Called and left msg. Mailed printout  °

## 2019-08-26 LAB — JAK2 (INCLUDING V617F AND EXON 12), MPL,& CALR W/RFL MPN PANEL (NGS)

## 2019-09-02 ENCOUNTER — Ambulatory Visit (INDEPENDENT_AMBULATORY_CARE_PROVIDER_SITE_OTHER): Payer: No Typology Code available for payment source | Admitting: Family Medicine

## 2019-09-02 ENCOUNTER — Other Ambulatory Visit: Payer: Self-pay

## 2019-09-02 ENCOUNTER — Encounter: Payer: Self-pay | Admitting: Family Medicine

## 2019-09-02 VITALS — BP 135/82 | HR 94 | Temp 98.2°F | Ht 71.0 in | Wt 242.2 lb

## 2019-09-02 DIAGNOSIS — Z0001 Encounter for general adult medical examination with abnormal findings: Secondary | ICD-10-CM | POA: Diagnosis not present

## 2019-09-02 DIAGNOSIS — I10 Essential (primary) hypertension: Secondary | ICD-10-CM | POA: Diagnosis not present

## 2019-09-02 NOTE — Patient Instructions (Signed)
It was very nice to see you today!  Keep up the good work!  No changes today.  I will see you back in year for your next checkup.  Please come back to me sooner if needed.  Take care, Dr Jerline Pain  Please try these tips to maintain a healthy lifestyle:   Eat at least 3 REAL meals and 1-2 snacks per day.  Aim for no more than 5 hours between eating.  If you eat breakfast, please do so within one hour of getting up.    Each meal should contain half fruits/vegetables, one quarter protein, and one quarter carbs (no bigger than a computer mouse)   Cut down on sweet beverages. This includes juice, soda, and sweet tea.     Drink at least 1 glass of water with each meal and aim for at least 8 glasses per day   Exercise at least 150 minutes every week.    Preventive Care 39-76 Years Old, Male Preventive care refers to lifestyle choices and visits with your health care provider that can promote health and wellness. This includes:  A yearly physical exam. This is also called an annual well check.  Regular dental and eye exams.  Immunizations.  Screening for certain conditions.  Healthy lifestyle choices, such as eating a healthy diet, getting regular exercise, not using drugs or products that contain nicotine and tobacco, and limiting alcohol use. What can I expect for my preventive care visit? Physical exam Your health care provider will check:  Height and weight. These may be used to calculate body mass index (BMI), which is a measurement that tells if you are at a healthy weight.  Heart rate and blood pressure.  Your skin for abnormal spots. Counseling Your health care provider may ask you questions about:  Alcohol, tobacco, and drug use.  Emotional well-being.  Home and relationship well-being.  Sexual activity.  Eating habits.  Work and work Statistician. What immunizations do I need?  Influenza (flu) vaccine  This is recommended every year. Tetanus,  diphtheria, and pertussis (Tdap) vaccine  You may need a Td booster every 10 years. Varicella (chickenpox) vaccine  You may need this vaccine if you have not already been vaccinated. Human papillomavirus (HPV) vaccine  If recommended by your health care provider, you may need three doses over 6 months. Measles, mumps, and rubella (MMR) vaccine  You may need at least one dose of MMR. You may also need a second dose. Meningococcal conjugate (MenACWY) vaccine  One dose is recommended if you are 35-24 years old and a Market researcher living in a residence hall, or if you have one of several medical conditions. You may also need additional booster doses. Pneumococcal conjugate (PCV13) vaccine  You may need this if you have certain conditions and were not previously vaccinated. Pneumococcal polysaccharide (PPSV23) vaccine  You may need one or two doses if you smoke cigarettes or if you have certain conditions. Hepatitis A vaccine  You may need this if you have certain conditions or if you travel or work in places where you may be exposed to hepatitis A. Hepatitis B vaccine  You may need this if you have certain conditions or if you travel or work in places where you may be exposed to hepatitis B. Haemophilus influenzae type b (Hib) vaccine  You may need this if you have certain risk factors. You may receive vaccines as individual doses or as more than one vaccine together in one shot (combination vaccines).  Talk with your health care provider about the risks and benefits of combination vaccines. What tests do I need? Blood tests  Lipid and cholesterol levels. These may be checked every 5 years starting at age 43.  Hepatitis C test.  Hepatitis B test. Screening   Diabetes screening. This is done by checking your blood sugar (glucose) after you have not eaten for a while (fasting).  Sexually transmitted disease (STD) testing. Talk with your health care provider about  your test results, treatment options, and if necessary, the need for more tests. Follow these instructions at home: Eating and drinking   Eat a diet that includes fresh fruits and vegetables, whole grains, lean protein, and low-fat dairy products.  Take vitamin and mineral supplements as recommended by your health care provider.  Do not drink alcohol if your health care provider tells you not to drink.  If you drink alcohol: ? Limit how much you have to 0-2 drinks a day. ? Be aware of how much alcohol is in your drink. In the [place] one drink equals one 12 oz bottle of beer (355 mL), one 5 oz glass of wine (148 mL), or one 1 oz glass of hard liquor (44 mL). Lifestyle  Take daily care of your teeth and gums.  Stay active. Exercise for at least 30 minutes on 5 or more days each week.  Do not use any products that contain nicotine or tobacco, such as cigarettes, e-cigarettes, and chewing tobacco. If you need help quitting, ask your health care provider.  If you are sexually active, practice safe sex. Use a condom or other form of protection to prevent STIs (sexually transmitted infections). What's next?  Go to your health care provider once a year for a well check visit.  Ask your health care provider how often you should have your eyes and teeth checked.  Stay up to date on all vaccines. This information is not intended to replace advice given to you by your health care provider. Make sure you discuss any questions you have with your health care provider. Document Revised: 12/25/2017 Document Reviewed: 12/25/2017 Elsevier Patient Education  2020 [employer].

## 2019-09-02 NOTE — Assessment & Plan Note (Signed)
At goal.  Continue losartan 100 mg daily and amlodipine 5 mg daily.  Last from a couple weeks ago within normal limits.  Continue home monitoring goal 140/90 or lower.

## 2019-09-02 NOTE — Progress Notes (Signed)
Chief Complaint:  Christian Villegas is a 24 y.o. male who presents today for his annual comprehensive physical exam.    Assessment/Plan:  Chronic Problems Addressed Today: Essential hypertension At goal.  Continue losartan 100 mg daily and amlodipine 5 mg daily.  Last from a couple weeks ago within normal limits.  Continue home monitoring goal 140/90 or lower.  Preventative Healthcare: UTD on screenings  Patient Counseling(The following topics were reviewed and/or handout was given):  -Nutrition: Stressed importance of moderation in sodium/caffeine intake, saturated fat and cholesterol, caloric balance, sufficient intake of fresh fruits, vegetables, and fiber.  -Stressed the importance of regular exercise.   -Substance Abuse: Discussed cessation/primary prevention of tobacco, alcohol, or other drug use; driving or other dangerous activities under the influence; availability of treatment for abuse.   -Injury prevention: Discussed safety belts, safety helmets, smoke detector, smoking near bedding or upholstery.   -Sexuality: Discussed sexually transmitted diseases, partner selection, use of condoms, avoidance of unintended pregnancy and contraceptive alternatives.   -Dental health: Discussed importance of regular tooth brushing, flossing, and dental visits.  -Health maintenance and immunizations reviewed. Please refer to Health maintenance section.  Return to care in 1 year for next preventative visit.     Subjective:  HPI:  He has no acute complaints today.   Lifestyle Diet: Balanced.  Exercise: Started working out again. Likes running.   Depression screen PHQ 2/9 07/22/2019  Decreased Interest 0  Down, Depressed, Hopeless 0  PHQ - 2 Score 0  Altered sleeping -  Tired, decreased energy -  Change in appetite -  Feeling bad or failure about yourself  -  Trouble concentrating -  Moving slowly or fidgety/restless -  Suicidal thoughts -  PHQ-9 Score -  Difficult doing work/chores  -    Health Maintenance Due  Topic Date Due  . Hepatitis C Screening  Never done     ROS: Per HPI, otherwise a complete review of systems was negative.   PMH:  The following were reviewed and entered/updated in epic: Past Medical History:  Diagnosis Date  . Family history of adverse reaction to anesthesia    Father: Difficult to awaken.  . Hypertension    Patient Active Problem List   Diagnosis Date Noted  . Thrombocytosis ([place]) 07/28/2019  . Essential hypertension 05/31/2019  . Excessive daytime sleepiness 08/21/2018  . UARS (upper airway resistance syndrome) 08/21/2018  . Non-restorative sleep 08/21/2018  . Loud snoring 08/21/2018  . Recurrent dislocation of left patella 08/30/2016  . Chondromalacia patellae, left knee 08/30/2016   Past Surgical History:  Procedure Laterality Date  . Left knee surgery  12/2017  . OPEN REDUCTION INTERNAL FIXATION (ORIF) METACARPAL Left 08/12/2015   Procedure: OPEN REDUCTION INTERNAL FIXATION (ORIF) LEFT 5th METACARPAL PHALANGEAL FRACTURE;  Surgeon: Roseanne Kaufman, MD;  Location: [facility];  Service: Orthopedics;  Laterality: Left;  . TONSILLECTOMY      Family History  Problem Relation Age of Onset  . Hypertension Father   . Hypertension Paternal Uncle   . Asthma Maternal Grandmother   . Birth defects Maternal Grandmother   . Diabetes Maternal Grandmother   . Hyperlipidemia Maternal Grandmother   . Hypertension Maternal Grandmother   . Birth defects Maternal Grandfather   . Diabetes Maternal Grandfather   . Hyperlipidemia Maternal Grandfather   . Hypertension Maternal Grandfather   . Birth defects Paternal Grandmother   . Heart disease Paternal Grandfather   . Hypertension Paternal Grandfather     Medications- reviewed and updated  Current Outpatient Medications  Medication Sig Dispense Refill  . amLODipine (NORVASC) 5 MG tablet Take 1 tablet (5 mg total) by mouth daily. 90 tablet 3  . losartan (COZAAR) 100 MG tablet Take 1  tablet (100 mg total) by mouth daily. 90 tablet 1   No current facility-administered medications for this visit.    Allergies-reviewed and updated No Known Allergies  Social History   Socioeconomic History  . Marital status: Single    Spouse name: Not on file  . Number of children: Not on file  . Years of education: Not on file  . Highest education level: Not on file  Occupational History  . Not on file  Tobacco Use  . Smoking status: Never Smoker  . Smokeless tobacco: Never Used  Vaping Use  . Vaping Use: Never used  Substance and Sexual Activity  . Alcohol use: Yes    Comment: Drinks rarely  . Drug use: No  . Sexual activity: Never  Other Topics Concern  . Not on file  Social History Narrative  . Not on file   Social Determinants of Health   Financial Resource Strain:   . Difficulty of Paying Living Expenses: Not on file  Food Insecurity:   . Worried About Charity fundraiser in the Last Year: Not on file  . Ran Out of Food in the Last Year: Not on file  Transportation Needs:   . Lack of Transportation (Medical): Not on file  . Lack of Transportation (Non-Medical): Not on file  Physical Activity:   . Days of Exercise per Week: Not on file  . Minutes of Exercise per Session: Not on file  Stress:   . Feeling of Stress : Not on file  Social Connections:   . Frequency of Communication with Friends and Family: Not on file  . Frequency of Social Gatherings with Friends and Family: Not on file  . Attends Religious Services: Not on file  . Active Member of Clubs or Organizations: Not on file  . Attends Archivist Meetings: Not on file  . Marital Status: Not on file        Objective:  Physical Exam: BP 135/82   Pulse 94   Temp 98.2 F (36.8 C)   Ht 5\' 11"  (1.803 m)   Wt 242 lb 3.2 oz (109.9 kg)   SpO2 97%   BMI 33.78 kg/m   Body mass index is 33.78 kg/m. Wt Readings from Last 3 Encounters:  09/02/19 242 lb 3.2 oz (109.9 kg)  08/12/19 (!) 238  lb 4.8 oz (108.1 kg)  07/22/19 245 lb 9.6 oz (111.4 kg)   Gen: NAD, resting comfortably HEENT: TMs normal bilaterally. OP clear. No thyromegaly noted.  CV: RRR with no murmurs appreciated Pulm: NWOB, CTAB with no crackles, wheezes, or rhonchi GI: Normal bowel sounds present. Soft, Nontender, Nondistended. MSK: no edema, cyanosis, or clubbing noted Skin: warm, dry Neuro: CN2-12 grossly intact. Strength 5/5 in upper and lower extremities. Reflexes symmetric and intact bilaterally.  Psych: Normal affect and thought content     Shamone Winzer M. Jerline Pain, MD 09/02/2019 1:41 PM

## 2019-09-14 ENCOUNTER — Encounter: Payer: Self-pay | Admitting: General Practice

## 2019-09-16 ENCOUNTER — Telehealth: Payer: Self-pay | Admitting: *Deleted

## 2019-09-16 MED ORDER — FERROUS SULFATE 325 (65 FE) MG PO TBEC
325.0000 mg | DELAYED_RELEASE_TABLET | Freq: Every day | ORAL | 3 refills | Status: DC
Start: 2019-09-16 — End: 2022-11-05

## 2019-09-16 NOTE — Telephone Encounter (Signed)
TCT patient regarding recent lab results.  Spoke with patient and informed him of his lab results and advised that his iron levels are a little low. Dr. Lorenso Courier recommends that he take 1 iron tablet daily. Pt is agreeable to this. Advised that his prescription will be sent in to his pharmacy on [address].  Advised to take iron supplement with a source of vitamin C-either orange juice or Vit. C tablet. And to avoid dairy products 2 hours before and 2 hours after he takes oral iron. He voiced understanding.

## 2019-09-16 NOTE — Telephone Encounter (Signed)
-----   Message from Orson Slick, MD sent at 09/15/2019  8:45 AM EDT ----- Please let Christian Villegas know that his test results have returned and that there is no evidence of a genetic mutation causing his high platelets. He did have a mildly low iron level. I would recommend 325mg  PO ferrous sulfate daily (to be called into a pharmacy of his choice) with f/u in January 2022.  ----- Message ----- From: [place]: 08/12/2019   3:09 PM EDT To: Orson Slick, MD

## 2019-11-04 ENCOUNTER — Other Ambulatory Visit: Payer: Self-pay

## 2019-11-04 ENCOUNTER — Ambulatory Visit (INDEPENDENT_AMBULATORY_CARE_PROVIDER_SITE_OTHER): Payer: No Typology Code available for payment source | Admitting: Family Medicine

## 2019-11-04 ENCOUNTER — Encounter: Payer: Self-pay | Admitting: Family Medicine

## 2019-11-04 VITALS — BP 143/75 | HR 99 | Temp 97.6°F | Ht 71.0 in | Wt 248.0 lb

## 2019-11-04 DIAGNOSIS — M549 Dorsalgia, unspecified: Secondary | ICD-10-CM

## 2019-11-04 MED ORDER — MELOXICAM 15 MG PO TABS
15.0000 mg | ORAL_TABLET | Freq: Every day | ORAL | 0 refills | Status: DC
Start: 2019-11-04 — End: 2019-12-05

## 2019-11-04 MED ORDER — CYCLOBENZAPRINE HCL 10 MG PO TABS
10.0000 mg | ORAL_TABLET | Freq: Three times a day (TID) | ORAL | 0 refills | Status: DC | PRN
Start: 2019-11-04 — End: 2022-11-05

## 2019-11-04 NOTE — Patient Instructions (Signed)
It was very nice to see you today!  Please start the meloxicam and Flexeril.  Let me know if not improving over the next 1 to 2 weeks and we can refer you to see a physical therapist.  Take care, Dr Jerline Pain  Please try these tips to maintain a healthy lifestyle:   Eat at least 3 REAL meals and 1-2 snacks per day.  Aim for no more than 5 hours between eating.  If you eat breakfast, please do so within one hour of getting up.    Each meal should contain half fruits/vegetables, one quarter protein, and one quarter carbs (no bigger than a computer mouse)   Cut down on sweet beverages. This includes juice, soda, and sweet tea.     Drink at least 1 glass of water with each meal and aim for at least 8 glasses per day   Exercise at least 150 minutes every week.

## 2019-11-04 NOTE — Progress Notes (Signed)
   Christian Villegas is a 24 y.o. male who presents today for an office visit.  Assessment/Plan:  New/Acute Problems: Mid Back Pain Likely muscular.  No red flags.  No signs of scoliosis on my exam and does not have any midline tenderness.  Will start Mobic and Flexeril.  Offered referral to physical therapy however patient declined.  If no improvement in the next 1 to 2 weeks he will reconsider.  Discussed reasons return to care.    Subjective:  HPI:  Been having back for few months. Located mostly in upper back. Tried ibuprofen which does not help. Worse with exercise and working with lacrosse. Worse with twisting.  No reported weakness or numbness.  No reported bowel or bladder incontinence.  No obvious injuries or precipitating events.       Objective:  Physical Exam: BP (!) 143/75   Pulse 99   Temp 97.6 F (36.4 C) (Temporal)   Ht 5\' 11"  (1.803 m)   Wt 248 lb (112.5 kg)   SpO2 99%   BMI 34.59 kg/m   Gen: No acute distress, resting comfortably MSK:  -Back: No deformities.  Tender to palpation along right midline paraspinal muscles. -Lower extremities.  Full strength and range of motion. Neuro: Grossly normal, moves all extremities Psych: Normal affect and thought content      Christian Villegas M. Jerline Pain, MD 11/04/2019 1:12 PM

## 2019-12-04 ENCOUNTER — Other Ambulatory Visit: Payer: Self-pay | Admitting: Family Medicine

## 2019-12-29 ENCOUNTER — Other Ambulatory Visit: Payer: Self-pay | Admitting: Family Medicine

## 2019-12-31 ENCOUNTER — Telehealth: Payer: Self-pay | Admitting: Hematology and Oncology

## 2019-12-31 NOTE — Telephone Encounter (Signed)
R/s 1/28 appt per provider PAL. Called and left msg. Mailed calendar

## 2020-01-06 ENCOUNTER — Other Ambulatory Visit: Payer: Self-pay | Admitting: Family Medicine

## 2020-01-26 ENCOUNTER — Ambulatory Visit (INDEPENDENT_AMBULATORY_CARE_PROVIDER_SITE_OTHER): Payer: No Typology Code available for payment source | Admitting: Family Medicine

## 2020-01-26 ENCOUNTER — Encounter: Payer: Self-pay | Admitting: Family Medicine

## 2020-01-26 ENCOUNTER — Other Ambulatory Visit: Payer: Self-pay

## 2020-01-26 VITALS — BP 138/87 | HR 87 | Temp 98.8°F | Ht 71.0 in | Wt 249.0 lb

## 2020-01-26 DIAGNOSIS — I10 Essential (primary) hypertension: Secondary | ICD-10-CM | POA: Diagnosis not present

## 2020-01-26 DIAGNOSIS — Z111 Encounter for screening for respiratory tuberculosis: Secondary | ICD-10-CM

## 2020-01-26 NOTE — Patient Instructions (Signed)
It was very nice to see you today!  Keep up the good work!  Please come back in a couple days to have your TB skin test read.  Please stop the amlodipine.  Keep an eye on your blood pressure and let me know if persistently 140/90 or higher.  I will see back in year for your next physical.  Take care, Dr Jerline Pain  Please try these tips to maintain a healthy lifestyle:   Eat at least 3 REAL meals and 1-2 snacks per day.  Aim for no more than 5 hours between eating.  If you eat breakfast, please do so within one hour of getting up.    Each meal should contain half fruits/vegetables, one quarter protein, and one quarter carbs (no bigger than a computer mouse)   Cut down on sweet beverages. This includes juice, soda, and sweet tea.     Drink at least 1 glass of water with each meal and aim for at least 8 glasses per day   Exercise at least 150 minutes every week.    Preventive Care 79-54 Years Old, Male Preventive care refers to lifestyle choices and visits with your health care provider that can promote health and wellness. This includes:  A yearly physical exam. This is also called an annual wellness visit.  Regular dental and eye exams.  Immunizations.  Screening for certain conditions.  Healthy lifestyle choices, such as: ? Eating a healthy diet. ? Getting regular exercise. ? Not using drugs or products that contain nicotine and tobacco. ? Limiting alcohol use. What can I expect for my preventive care visit? Physical exam Your health care provider may check your:  Height and weight. These may be used to calculate your BMI (body mass index). BMI is a measurement that tells if you are at a healthy weight.  Heart rate and blood pressure.  Body temperature.  Skin for abnormal spots. Counseling Your health care provider may ask you questions about your:  Past medical problems.  Family's medical history.  Alcohol, tobacco, and drug use.  Emotional  well-being.  Home life and relationship well-being.  Sexual activity.  Diet, exercise, and sleep habits.  Work and work Statistician.  Access to firearms. What immunizations do I need? Vaccines are usually given at various ages, according to a schedule. Your health care provider will recommend vaccines for you based on your age, medical history, and lifestyle or other factors, such as travel or where you work.   What tests do I need? Blood tests  Lipid and cholesterol levels. These may be checked every 5 years starting at age 58.  Hepatitis C test.  Hepatitis B test. Screening  Diabetes screening. This is done by checking your blood sugar (glucose) after you have not eaten for a while (fasting).  Genital exam to check for testicular cancer or hernias.  STD (sexually transmitted disease) testing, if you are at risk. Talk with your health care provider about your test results, treatment options, and if necessary, the need for more tests.   Follow these instructions at home: Eating and drinking  Eat a healthy diet that includes fresh fruits and vegetables, whole grains, lean protein, and low-fat dairy products.  Drink enough fluid to keep your urine pale yellow.  Take vitamin and mineral supplements as recommended by your health care provider.  Do not drink alcohol if your health care provider tells you not to drink.  If you drink alcohol: ? Limit how much you have to 0-2  drinks a day. ? Be aware of how much alcohol is in your drink. In the [place] one drink equals one 12 oz bottle of beer (355 mL), one 5 oz glass of wine (148 mL), or one 1 oz glass of hard liquor (44 mL).   Lifestyle  Take daily care of your teeth and gums. Brush your teeth every morning and night with fluoride toothpaste. Floss one time each day.  Stay active. Exercise for at least 30 minutes 5 or more days each week.  Do not use any products that contain nicotine or tobacco, such as cigarettes,  e-cigarettes, and chewing tobacco. If you need help quitting, ask your health care provider.  Do not use drugs.  If you are sexually active, practice safe sex. Use a condom or other form of protection to prevent STIs (sexually transmitted infections).  Find healthy ways to cope with stress, such as: ? Meditation, yoga, or listening to music. ? Journaling. ? Talking to a trusted person. ? Spending time with friends and family. Safety  Always wear your seat belt while driving or riding in a vehicle.  Do not drive: ? If you have been drinking alcohol. Do not ride with someone who has been drinking. ? When you are tired or distracted. ? While texting.  Wear a helmet and other protective equipment during sports activities.  If you have firearms in your house, make sure you follow all gun safety procedures.  Seek help if you have been physically or sexually abused. What's next?  Go to your health care provider once a year for an annual wellness visit.  Ask your health care provider how often you should have your eyes and teeth checked.  Stay up to date on all vaccines. This information is not intended to replace advice given to you by your health care provider. Make sure you discuss any questions you have with your health care provider. Document Revised: 09/16/2018 Document Reviewed: 12/25/2017 Elsevier Patient Education  2021 [employer].

## 2020-01-26 NOTE — Assessment & Plan Note (Signed)
Having some leg swelling.  We will stop amlodipine.  Continue losartan 100 mg daily.  He will check in with me in a couple of weeks to let me know how his blood pressures are looking.  Continue home monitoring goal 140/90 or lower.

## 2020-01-26 NOTE — Progress Notes (Signed)
Chief Complaint:  Christian Villegas is a 25 y.o. male who presents today for his annual comprehensive physical exam.    Assessment/Plan:  Chronic Problems Addressed Today: Essential hypertension Having some leg swelling.  We will stop amlodipine.  Continue losartan 100 mg daily.  He will check in with me in a couple of weeks to let me know how his blood pressures are looking.  Continue home monitoring goal 140/90 or lower.   Preventative Healthcare: Form for work completed today.  Will be getting TB skin test today as well. UTD on other preventative healthcare.   Patient Counseling(The following topics were reviewed and/or handout was given):  -Nutrition: Stressed importance of moderation in sodium/caffeine intake, saturated fat and cholesterol, caloric balance, sufficient intake of fresh fruits, vegetables, and fiber.  -Stressed the importance of regular exercise.   -Substance Abuse: Discussed cessation/primary prevention of tobacco, alcohol, or other drug use; driving or other dangerous activities under the influence; availability of treatment for abuse.   -Injury prevention: Discussed safety belts, safety helmets, smoke detector, smoking near bedding or upholstery.   -Sexuality: Discussed sexually transmitted diseases, partner selection, use of condoms, avoidance of unintended pregnancy and contraceptive alternatives.   -Dental health: Discussed importance of regular tooth brushing, flossing, and dental visits.  -Health maintenance and immunizations reviewed. Please refer to Health maintenance section.  Return to care in 1 year for next preventative visit.     Subjective:  HPI:  He has no acute complaints today.   Lifestyle Diet: Balanced.  Exercise: Busy with lacrosse.   Depression screen PHQ 2/9 07/22/2019  Decreased Interest 0  Down, Depressed, Hopeless 0  PHQ - 2 Score 0  Altered sleeping -  Tired, decreased energy -  Change in appetite -  Feeling bad or failure about  yourself  -  Trouble concentrating -  Moving slowly or fidgety/restless -  Suicidal thoughts -  PHQ-9 Score -  Difficult doing work/chores -    Health Maintenance Due  Topic Date Due  . Hepatitis C Screening  Never done  . COVID-19 Vaccine (3 - Pfizer risk 4-dose series) 10/12/2019     ROS: Per HPI, otherwise a complete review of systems was negative.   PMH:  The following were reviewed and entered/updated in epic: Past Medical History:  Diagnosis Date  . Family history of adverse reaction to anesthesia    Father: Difficult to awaken.  . Hypertension    Patient Active Problem List   Diagnosis Date Noted  . Thrombocytosis 07/28/2019  . Essential hypertension 05/31/2019  . UARS (upper airway resistance syndrome) 08/21/2018  . Recurrent dislocation of left patella 08/30/2016  . Chondromalacia patellae, left knee 08/30/2016   Past Surgical History:  Procedure Laterality Date  . Left knee surgery  12/2017  . OPEN REDUCTION INTERNAL FIXATION (ORIF) METACARPAL Left 08/12/2015   Procedure: OPEN REDUCTION INTERNAL FIXATION (ORIF) LEFT 5th METACARPAL PHALANGEAL FRACTURE;  Surgeon: Roseanne Kaufman, MD;  Location: [facility];  Service: Orthopedics;  Laterality: Left;  . TONSILLECTOMY      Family History  Problem Relation Age of Onset  . Hypertension Father   . Hypertension Paternal Uncle   . Asthma Maternal Grandmother   . Birth defects Maternal Grandmother   . Diabetes Maternal Grandmother   . Hyperlipidemia Maternal Grandmother   . Hypertension Maternal Grandmother   . Birth defects Maternal Grandfather   . Diabetes Maternal Grandfather   . Hyperlipidemia Maternal Grandfather   . Hypertension Maternal Grandfather   . Birth  defects Paternal Grandmother   . Heart disease Paternal Grandfather   . Hypertension Paternal Grandfather     Medications- reviewed and updated Current Outpatient Medications  Medication Sig Dispense Refill  . cyclobenzaprine (FLEXERIL) 10 MG tablet  Take 1 tablet (10 mg total) by mouth 3 (three) times daily as needed for muscle spasms. 30 tablet 0  . ferrous sulfate 325 (65 FE) MG EC tablet Take 1 tablet (325 mg total) by mouth daily with breakfast. Take with a source of Vitamin C 30 tablet 3  . losartan (COZAAR) 100 MG tablet TAKE 1 TABLET BY MOUTH EVERY DAY 90 tablet 1  . meloxicam (MOBIC) 15 MG tablet TAKE 1 TABLET BY MOUTH EVERY DAY 30 tablet 0   No current facility-administered medications for this visit.    Allergies-reviewed and updated No Known Allergies  Social History   Socioeconomic History  . Marital status: Single    Spouse name: Not on file  . Number of children: Not on file  . Years of education: Not on file  . Highest education level: Not on file  Occupational History  . Not on file  Tobacco Use  . Smoking status: Never Smoker  . Smokeless tobacco: Never Used  Vaping Use  . Vaping Use: Never used  Substance and Sexual Activity  . Alcohol use: Yes    Comment: Drinks rarely  . Drug use: No  . Sexual activity: Never  Other Topics Concern  . Not on file  Social History Narrative  . Not on file   Social Determinants of Health   Financial Resource Strain: Not on file  Food Insecurity: Not on file  Transportation Needs: Not on file  Physical Activity: Not on file  Stress: Not on file  Social Connections: Not on file        Objective:  Physical Exam: BP 138/87   Pulse 87   Temp 98.8 F (37.1 C) (Temporal)   Ht 5\' 11"  (1.803 m)   Wt 249 lb (112.9 kg)   SpO2 98%   BMI 34.73 kg/m   Body mass index is 34.73 kg/m. Wt Readings from Last 3 Encounters:  01/26/20 249 lb (112.9 kg)  11/04/19 248 lb (112.5 kg)  09/02/19 242 lb 3.2 oz (109.9 kg)   Gen: NAD, resting comfortably HEENT: TMs normal bilaterally. OP clear. No thyromegaly noted.  CV: RRR with no murmurs appreciated Pulm: NWOB, CTAB with no crackles, wheezes, or rhonchi GI: Normal bowel sounds present. Soft, Nontender, Nondistended. MSK:  no edema, cyanosis, or clubbing noted Skin: warm, dry Neuro: CN2-12 grossly intact. Strength 5/5 in upper and lower extremities. Reflexes symmetric and intact bilaterally.  Psych: Normal affect and thought content     Sadiel Mota M. Jerline Pain, MD 01/26/2020 9:22 AM

## 2020-01-27 ENCOUNTER — Other Ambulatory Visit: Payer: Self-pay

## 2020-01-28 ENCOUNTER — Ambulatory Visit: Payer: No Typology Code available for payment source | Admitting: *Deleted

## 2020-01-28 DIAGNOSIS — Z111 Encounter for screening for respiratory tuberculosis: Secondary | ICD-10-CM

## 2020-01-28 LAB — TB SKIN TEST
Induration: 0 mm
TB Skin Test: NEGATIVE

## 2020-01-28 NOTE — Progress Notes (Signed)
Patient present for PPD reading  No redness or induration on injection area  Form and PPD copy given to patient

## 2020-02-08 ENCOUNTER — Other Ambulatory Visit: Payer: Self-pay | Admitting: Hematology and Oncology

## 2020-02-08 DIAGNOSIS — D75839 Thrombocytosis, unspecified: Secondary | ICD-10-CM

## 2020-02-09 ENCOUNTER — Inpatient Hospital Stay (HOSPITAL_BASED_OUTPATIENT_CLINIC_OR_DEPARTMENT_OTHER): Payer: No Typology Code available for payment source | Admitting: Hematology and Oncology

## 2020-02-09 ENCOUNTER — Inpatient Hospital Stay: Payer: No Typology Code available for payment source | Attending: Hematology and Oncology

## 2020-02-09 ENCOUNTER — Encounter: Payer: Self-pay | Admitting: Hematology and Oncology

## 2020-02-09 ENCOUNTER — Other Ambulatory Visit: Payer: Self-pay

## 2020-02-09 VITALS — BP 144/78 | HR 92 | Temp 99.3°F | Resp 18 | Ht 71.0 in | Wt 245.7 lb

## 2020-02-09 DIAGNOSIS — D75839 Thrombocytosis, unspecified: Secondary | ICD-10-CM | POA: Diagnosis present

## 2020-02-09 DIAGNOSIS — Z79899 Other long term (current) drug therapy: Secondary | ICD-10-CM | POA: Diagnosis not present

## 2020-02-09 LAB — CMP (CANCER CENTER ONLY)
ALT: 37 U/L (ref 0–44)
AST: 20 U/L (ref 15–41)
Albumin: 5.1 g/dL — ABNORMAL HIGH (ref 3.5–5.0)
Alkaline Phosphatase: 64 U/L (ref 38–126)
Anion gap: 9 (ref 5–15)
BUN: 16 mg/dL (ref 6–20)
CO2: 27 mmol/L (ref 22–32)
Calcium: 10.1 mg/dL (ref 8.9–10.3)
Chloride: 104 mmol/L (ref 98–111)
Creatinine: 1.09 mg/dL (ref 0.61–1.24)
GFR, Estimated: >60 mL/min (ref >60)
Glucose, Bld: 88 mg/dL (ref 70–99)
Potassium: 4.5 mmol/L (ref 3.5–5.1)
Sodium: 140 mmol/L (ref 135–145)
Total Bilirubin: 1.1 mg/dL (ref 0.3–1.2)
Total Protein: 8.4 g/dL — ABNORMAL HIGH (ref 6.5–8.1)

## 2020-02-09 LAB — CBC WITH DIFFERENTIAL (CANCER CENTER ONLY)
Abs Immature Granulocytes: 0.03 10*3/uL (ref 0.00–0.07)
Basophils Absolute: 0.1 10*3/uL (ref 0.0–0.1)
Basophils Relative: 1 %
Eosinophils Absolute: 0.3 10*3/uL (ref 0.0–0.5)
Eosinophils Relative: 4 %
HCT: 49.3 % (ref 39.0–52.0)
Hemoglobin: 16.6 g/dL (ref 13.0–17.0)
Immature Granulocytes: 0 %
Lymphocytes Relative: 30 %
Lymphs Abs: 2.8 10*3/uL (ref 0.7–4.0)
MCH: 31.1 pg (ref 26.0–34.0)
MCHC: 33.7 g/dL (ref 30.0–36.0)
MCV: 92.5 fL (ref 80.0–100.0)
Monocytes Absolute: 0.7 10*3/uL (ref 0.1–1.0)
Monocytes Relative: 7 %
Neutro Abs: 5.3 10*3/uL (ref 1.7–7.7)
Neutrophils Relative %: 58 %
Platelet Count: 424 10*3/uL — ABNORMAL HIGH (ref 150–400)
RBC: 5.33 MIL/uL (ref 4.22–5.81)
RDW: 12.2 % (ref 11.5–15.5)
WBC Count: 9.2 10*3/uL (ref 4.0–10.5)
nRBC: 0 % (ref 0.0–0.2)

## 2020-02-09 LAB — LACTATE DEHYDROGENASE: LDH: 125 U/L (ref 98–192)

## 2020-02-09 NOTE — Progress Notes (Signed)
[facility] [phone]   [phone]  PROGRESS NOTE  Patient Care Team: Vivi Barrack, MD as PCP - General (Family Medicine)  Hematological/Oncological History # Thrombocytosis 1) 08/20/2017: WBC 8.9, Hgb 15.9, MCV 91.3, Plt 489 2) 08/05/2018: WBC 8.6, Hgb 15.9, MCV 92.6, Plt 444 3) 07/08/2019: WBC 11.6, Hgb 16.1, MCV 91.5, Plt 471 4) 07/22/2019: WBC 9.3, Hgb 16.2, MCV 90.6, Plt 504 5) 08/12/2019: establish care with Dr. Lorenso Courier. JAK2 testing negative, mildly low iron sat at 12%  Interval History:  Christian Villegas 24 y.o. male with medical history significant for thrombocytosis who presents for a follow up visit. The patient's last visit was on 08/12/2019 at which time he established care. In the interim since the last visit he has been in his normal state of health with no changes.  On exam today Christian Villegas notes that he has not taken the p.o. iron that was prescribed previously.  He notes that he is not sure if the pharmacy filled it, but notes that he be willing and able to start it soon.  He reports that he has been having some increase in leg swelling in the night he does have to prop them up.  He also notes that he has otherwise been quite healthy with no bleeding bruising, or dark stools.  He reports that he did speak more with his family and notes that his father has a low white blood cell count, his uncle has elevated white blood cells, and his mother has low iron.  Here otherwise denies any fevers, chills, sweats, nausea, vomiting or diarrhea.  A full 10 point ROS is listed below  MEDICAL HISTORY:  Past Medical History:  Diagnosis Date  . Family history of adverse reaction to anesthesia    Father: Difficult to awaken.  . Hypertension     SURGICAL HISTORY: Past Surgical History:  Procedure Laterality Date  . Left knee surgery  12/2017  . OPEN REDUCTION INTERNAL FIXATION (ORIF) METACARPAL Left 08/12/2015   Procedure: OPEN REDUCTION INTERNAL  FIXATION (ORIF) LEFT 5th METACARPAL PHALANGEAL FRACTURE;  Surgeon: Roseanne Kaufman, MD;  Location: [facility];  Service: Orthopedics;  Laterality: Left;  . TONSILLECTOMY      SOCIAL HISTORY: Social History   Socioeconomic History  . Marital status: Single    Spouse name: Not on file  . Number of children: Not on file  . Years of education: Not on file  . Highest education level: Not on file  Occupational History  . Not on file  Tobacco Use  . Smoking status: Never Smoker  . Smokeless tobacco: Never Used  Vaping Use  . Vaping Use: Never used  Substance and Sexual Activity  . Alcohol use: Yes    Comment: Drinks rarely  . Drug use: No  . Sexual activity: Never  Other Topics Concern  . Not on file  Social History Narrative  . Not on file   Social Determinants of Health   Financial Resource Strain: Not on file  Food Insecurity: Not on file  Transportation Needs: Not on file  Physical Activity: Not on file  Stress: Not on file  Social Connections: Not on file  Intimate Partner Violence: Not on file    FAMILY HISTORY: Family History  Problem Relation Age of Onset  . Hypertension Father   . Hypertension Paternal Uncle   . Asthma Maternal Grandmother   . Birth defects Maternal Grandmother   . Diabetes Maternal Grandmother   . Hyperlipidemia Maternal Grandmother   .  Hypertension Maternal Grandmother   . Birth defects Maternal Grandfather   . Diabetes Maternal Grandfather   . Hyperlipidemia Maternal Grandfather   . Hypertension Maternal Grandfather   . Birth defects Paternal Grandmother   . Heart disease Paternal Grandfather   . Hypertension Paternal Grandfather     ALLERGIES:  has No Known Allergies.  MEDICATIONS:  Current Outpatient Medications  Medication Sig Dispense Refill  . cyclobenzaprine (FLEXERIL) 10 MG tablet Take 1 tablet (10 mg total) by mouth 3 (three) times daily as needed for muscle spasms. 30 tablet 0  . ferrous sulfate 325 (65 FE) MG EC tablet Take 1  tablet (325 mg total) by mouth daily with breakfast. Take with a source of Vitamin C 30 tablet 3  . losartan (COZAAR) 100 MG tablet TAKE 1 TABLET BY MOUTH EVERY DAY 90 tablet 1  . meloxicam (MOBIC) 15 MG tablet TAKE 1 TABLET BY MOUTH EVERY DAY 30 tablet 0   No current facility-administered medications for this visit.    REVIEW OF SYSTEMS:   Constitutional: ( - ) fevers, ( - )  chills , ( - ) night sweats Eyes: ( - ) blurriness of vision, ( - ) double vision, ( - ) watery eyes Ears, nose, mouth, throat, and face: ( - ) mucositis, ( - ) sore throat Respiratory: ( - ) cough, ( - ) dyspnea, ( - ) wheezes Cardiovascular: ( - ) palpitation, ( - ) chest discomfort, ( - ) lower extremity swelling Gastrointestinal:  ( - ) nausea, ( - ) heartburn, ( - ) change in bowel habits Skin: ( - ) abnormal skin rashes Lymphatics: ( - ) new lymphadenopathy, ( - ) easy bruising Neurological: ( - ) numbness, ( - ) tingling, ( - ) new weaknesses Behavioral/Psych: ( - ) mood change, ( - ) new changes  All other systems were reviewed with the patient and are negative.  PHYSICAL EXAMINATION:  Vitals:   02/09/20 1521  BP: (!) 144/78  Pulse: 92  Resp: 18  Temp: 99.3 F (37.4 C)  SpO2: 100%   Filed Weights   02/09/20 1521  Weight: 245 lb 11.2 oz (111.4 kg)    GENERAL: well appearing young male alert, no distress and comfortable SKIN: skin color, texture, turgor are normal, no rashes or significant lesions EYES: conjunctiva are pink and non-injected, sclera clear LUNGS: clear to auscultation and percussion with normal breathing effort HEART: regular rate & rhythm and no murmurs and no lower extremity edema Musculoskeletal: no cyanosis of digits and no clubbing  PSYCH: alert & oriented x 3, fluent speech NEURO: no focal motor/sensory deficits  LABORATORY DATA:  I have reviewed the data as listed CBC Latest Ref Rng & Units 02/09/2020 08/12/2019 07/22/2019  WBC 4.0 - 10.5 K/uL 9.2 7.9 9.3  Hemoglobin 13.0  - 17.0 g/dL 16.6 16.7 16.2  Hematocrit 39.0 - 52.0 % 49.3 50.0 47.5  Platelets 150 - 400 K/uL 424(H) 470(H) 504(H)    CMP Latest Ref Rng & Units 02/09/2020 08/12/2019 07/22/2019  Glucose 70 - 99 mg/dL 88 103(H) 95  BUN 6 - 20 mg/dL _0 Creatinine 0.61 - 1.24 mg/dL 1.09 1.14 1.01  Sodium 135 - 145 mmol/L 140 141 137  Potassium 3.5 - 5.1 mmol/L 4.5 4.0 4.5  Chloride 98 - 111 mmol/L 104 104 100  CO2 22 - 32 mmol/L _1 Calcium 8.9 - 10.3 mg/dL 10.1 10.6(H) 10.2  Total Protein 6.5 - 8.1 g/dL 8.4(H) 8.3(H)  7.3  Total Bilirubin 0.3 - 1.2 mg/dL 1.1 0.7 0.7  Alkaline Phos 38 - 126 U/L 64 75 67  AST 15 - 41 U/L 20 35 19  ALT 0 - 44 U/L 37 74(H) 35     RADIOGRAPHIC STUDIES: No results found.  ASSESSMENT & PLAN Christian Villegas 24 y.o. male with medical history significant for thrombocytosis who presents for a follow up visit.    After review the labs, review the records, discussion with the patient the findings are most consistent with a thrombocytosis of unclear etiology.  There are 3 possible explanations for the patient's thrombocytosis.  The first would be iron deficiency anemia which is the most common cause of thrombocytosis.  The etiology of the iron deficiency anemia would not be clear with prompt further work-up. He did have a modestly low iron sat, and therefore we can try PO iron therapy to see if this improves his Plts. The second would be chronic inflammation.  The patient does have chronic knee pain from arthritis but does not have any other signs or symptoms of an underlying rheumatological condition. Additionally ESR/CRP were negative, so this is less likely.  The third possibility is the patient has a myeloproliferative neoplasm therefore we ordered a JAK2 panel with reflex which was negative and therefore effectively rules this out. We did order a BCR/ABL today to complete the MPN workup.  In the event we are not able to find a clear etiology today I would recommend that the  patient return to clinic in 6 months time.  We find intervention is necessary we will have the patient return sooner.  # Thrombocytosis --today will order repeat CBC, CMP -- ESR and CRP were WNL, no evidence of underlying inflammation --MPN less likely but given the chronicity may have a driver mutation causing this thrombocytosis. JAK2 MPN panel was negative, recommend a BCR/ABL FISH today.  --recommend trial of PO iron to improve iron sats and possibly improve thrombocytosis.  --RTC in 6 months or sooner if an etiology requiring intervention is found.   No orders of the defined types were placed in this encounter.   All questions were answered. The patient knows to call the clinic with any problems, questions or concerns.  A total of more than 30 minutes were spent on this encounter and over half of that time was spent on counseling and coordination of care as outlined above.   Ledell Peoples, MD Department of Hematology/Oncology [place] at [facility] Phone: [phone] Pager: [phone] Email: [email or web address]  02/09/2020 4:38 PM

## 2020-02-11 ENCOUNTER — Telehealth: Payer: Self-pay | Admitting: Hematology and Oncology

## 2020-02-11 ENCOUNTER — Other Ambulatory Visit: Payer: No Typology Code available for payment source

## 2020-02-11 ENCOUNTER — Ambulatory Visit: Payer: No Typology Code available for payment source | Admitting: Hematology and Oncology

## 2020-02-11 NOTE — Telephone Encounter (Signed)
Scheduled per 1/26 los. Called pt no answer and unable to leave a msg. Mailing appt letter and calendar printout

## 2020-02-17 LAB — BCR ABL1 FISH (GENPATH)

## 2020-04-24 IMAGING — DX DG TOE GREAT 2+V*R*
3 series · 3 of 3 positions shown · non-contrast
Comparison: 01/25/2011.

CLINICAL DATA: Right great toe pain. No reported injury.

EXAM:
RIGHT GREAT TOE

[toes dp]
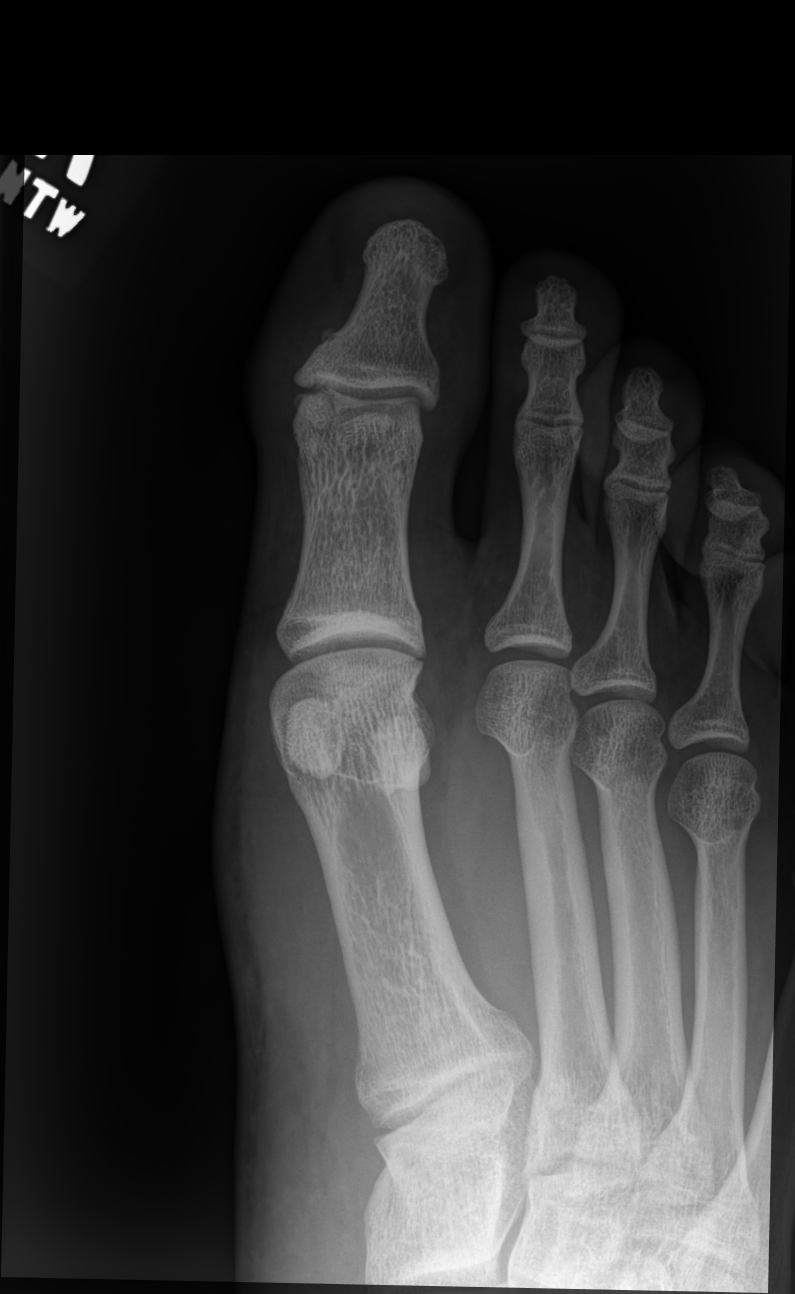

[toes oblique]
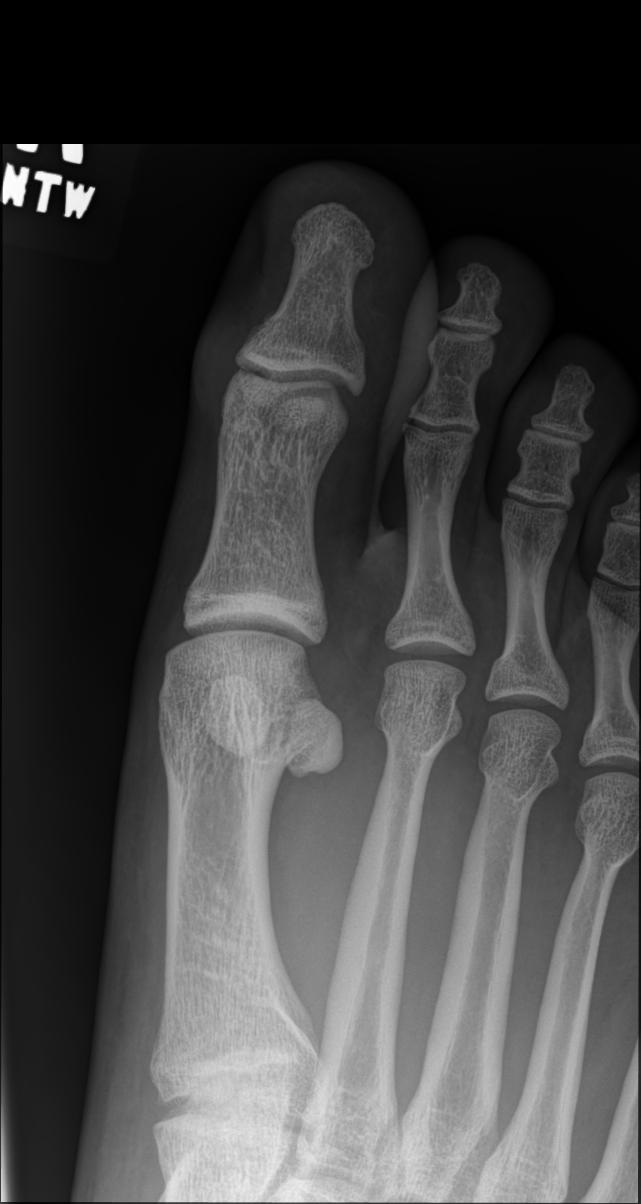

[toes lat]
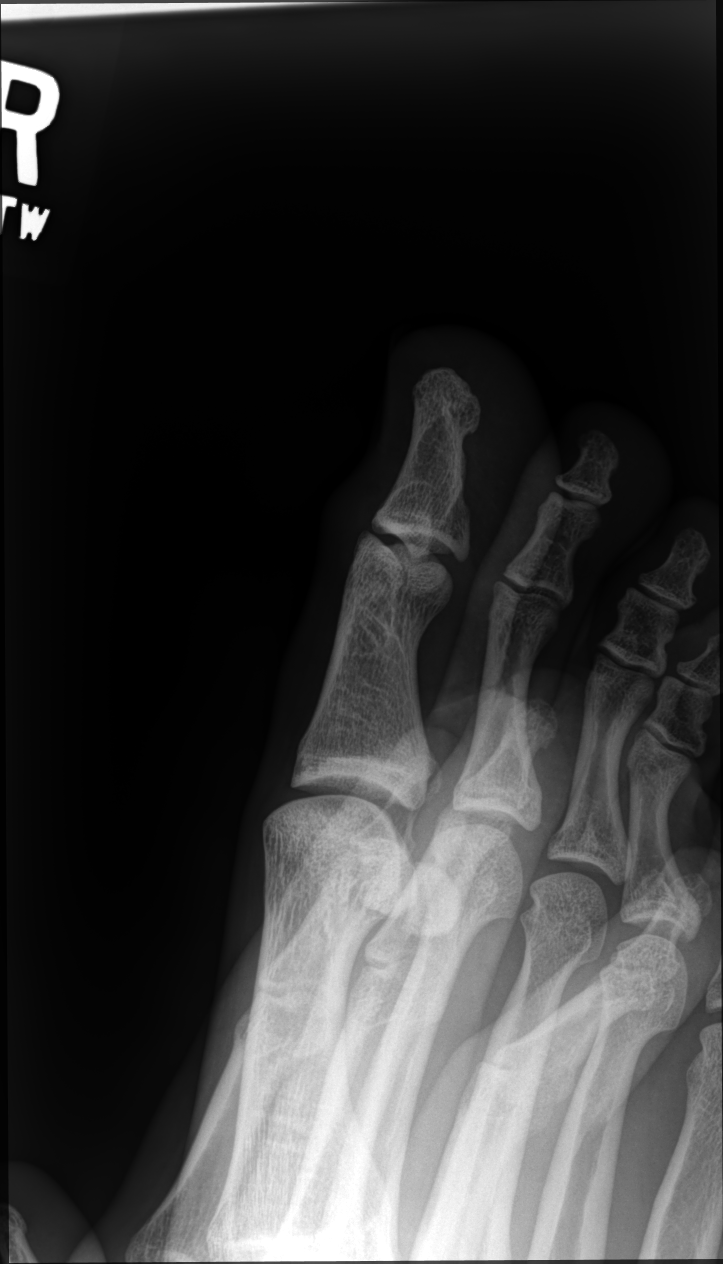

[3 of 3 positions shown; findings below may reference images not displayed]

FINDINGS: Nondisplaced fracture in the medial aspect of the base of the 1st
distal phalanx. There is also a minimally displaced fracture in the
distal aspect of the 1st proximal phalanx, medially. Both have
intra-articular extension.
IMPRESSION: Fractures of the 1st distal and 1st proximal phalanges, as described
above.

## 2020-08-09 ENCOUNTER — Inpatient Hospital Stay
Payer: No Typology Code available for payment source | Attending: Hematology and Oncology | Admitting: Hematology and Oncology

## 2020-08-09 ENCOUNTER — Inpatient Hospital Stay: Payer: No Typology Code available for payment source

## 2020-08-09 ENCOUNTER — Other Ambulatory Visit: Payer: Self-pay | Admitting: Hematology and Oncology

## 2020-08-09 DIAGNOSIS — D75839 Thrombocytosis, unspecified: Secondary | ICD-10-CM

## 2021-10-08 ENCOUNTER — Encounter: Payer: Self-pay | Admitting: *Deleted

## 2021-12-27 ENCOUNTER — Encounter: Payer: Self-pay | Admitting: *Deleted

## 2022-11-05 ENCOUNTER — Ambulatory Visit: Payer: No Typology Code available for payment source | Admitting: Family Medicine

## 2022-11-05 ENCOUNTER — Encounter: Payer: Self-pay | Admitting: Family Medicine

## 2022-11-05 VITALS — BP 160/98 | HR 82 | Temp 98.0°F | Ht 71.0 in | Wt 241.2 lb

## 2022-11-05 DIAGNOSIS — Z8481 Family history of carrier of genetic disease: Secondary | ICD-10-CM

## 2022-11-05 DIAGNOSIS — I10 Essential (primary) hypertension: Secondary | ICD-10-CM | POA: Diagnosis not present

## 2022-11-05 DIAGNOSIS — F209 Schizophrenia, unspecified: Secondary | ICD-10-CM | POA: Diagnosis not present

## 2022-11-05 DIAGNOSIS — Z131 Encounter for screening for diabetes mellitus: Secondary | ICD-10-CM

## 2022-11-05 DIAGNOSIS — Z13220 Encounter for screening for lipoid disorders: Secondary | ICD-10-CM | POA: Diagnosis not present

## 2022-11-05 DIAGNOSIS — Z0001 Encounter for general adult medical examination with abnormal findings: Secondary | ICD-10-CM

## 2022-11-05 LAB — COMPREHENSIVE METABOLIC PANEL
ALT: 67 U/L — ABNORMAL HIGH (ref 0–53)
AST: 31 U/L (ref 0–37)
Albumin: 4.7 g/dL (ref 3.5–5.2)
Alkaline Phosphatase: 60 U/L (ref 39–117)
BUN: 11 mg/dL (ref 6–23)
CO2: 24 meq/L (ref 19–32)
Calcium: 9.8 mg/dL (ref 8.4–10.5)
Chloride: 106 meq/L (ref 96–112)
Creatinine, Ser: 0.89 mg/dL (ref 0.40–1.50)
GFR: 119.35 mL/min (ref >60.00)
Glucose, Bld: 106 mg/dL — ABNORMAL HIGH (ref 70–99)
Potassium: 4 meq/L (ref 3.5–5.1)
Sodium: 140 meq/L (ref 135–145)
Total Bilirubin: 0.3 mg/dL (ref 0.2–1.2)
Total Protein: 7.3 g/dL (ref 6.0–8.3)

## 2022-11-05 LAB — LIPID PANEL
Cholesterol: 185 mg/dL (ref 0–200)
HDL: 37.4 mg/dL — ABNORMAL LOW (ref >39.00)
LDL Cholesterol: 101 mg/dL — ABNORMAL HIGH (ref 0–99)
NonHDL: 147.29
Total CHOL/HDL Ratio: 5
Triglycerides: 232 mg/dL — ABNORMAL HIGH (ref 0.0–149.0)
VLDL: 46.4 mg/dL — ABNORMAL HIGH (ref 0.0–40.0)

## 2022-11-05 LAB — HEMOGLOBIN A1C: Hgb A1c MFr Bld: 5.2 % (ref 4.6–6.5)

## 2022-11-05 LAB — CBC
HCT: 48.5 % (ref 39.0–52.0)
Hemoglobin: 15.8 g/dL (ref 13.0–17.0)
MCHC: 32.7 g/dL (ref 30.0–36.0)
MCV: 94.1 fL (ref 78.0–100.0)
Platelets: 463 10*3/uL — ABNORMAL HIGH (ref 150.0–400.0)
RBC: 5.15 Mil/uL (ref 4.22–5.81)
RDW: 12.7 % (ref 11.5–15.5)
WBC: 7 10*3/uL (ref 4.0–10.5)

## 2022-11-05 LAB — TSH: TSH: 0.49 u[IU]/mL (ref 0.35–5.50)

## 2022-11-05 MED ORDER — LOSARTAN POTASSIUM 100 MG PO TABS: 100.0000 mg | ORAL_TABLET | Freq: Every day | ORAL | 1 refills | Status: AC

## 2022-11-05 NOTE — Progress Notes (Signed)
Chief Complaint:  Christian Villegas is a 27 y.o. male who presents today for his annual comprehensive physical exam.    Assessment/Plan:  Chronic Problems Addressed Today: Essential hypertension Blood pressure elevated today.  Is been off blood pressure medications.  Will restart losartan 100 mg daily.  Tolerated this well previously.  Check labs today.  He will follow-up with [place] in a few weeks via MyChart and let us know if persistently elevated at home.  He was previously on amlodipine however some leg edema with this.  Will not continue this at this point.  If he needs additional therapy beyond losartan 100 mg daily will try HCTZ.  Schizophrenia (HCC) Overall symptoms are stable.  He is somewhat blunted on exam today however no apparent AVH.  No reported SI or HI.  He is not currently on any medications.  He is agreeable to establish care with psychiatrist in the area.  Place referral today.  Family history of BRCA1 gene positive Mother diagnosed with BRCA1 positive breast cancer.  She wishes for him to get genetic counseling and genetic testing.  Will place referral today.   Preventative Healthcare: Check labs.   Patient Counseling(The following topics were reviewed and/or handout was given):  -Nutrition: Stressed importance of moderation in sodium/caffeine intake, saturated fat and cholesterol, caloric balance, sufficient intake of fresh fruits, vegetables, and fiber.  -Stressed the importance of regular exercise.   -Substance Abuse: Discussed cessation/primary prevention of tobacco, alcohol, or other drug use; driving or other dangerous activities under the influence; availability of treatment for abuse.   -Injury prevention: Discussed safety belts, safety helmets, smoke detector, smoking near bedding or upholstery.   -Sexuality: Discussed sexually transmitted diseases, partner selection, use of condoms, avoidance of unintended pregnancy and contraceptive alternatives.   -Dental  health: Discussed importance of regular tooth brushing, flossing, and dental visits.  -Health maintenance and immunizations reviewed. Please refer to Health maintenance section.  Return to care in 1 year for next preventative visit.     Subjective:  HPI:  He has no acute complaints today. He was last here about 2.5 years ago.  They moved to [place] in 2022 and moved back a few months ago.  They are now living in [facility].  Since her last visit he was diagnosed with schizophrenia.  He has been following with psychiatry in [place].  They have tried a few different medications however none is been particularly effective.  He is not currently on any medications.  His mother is with him today.  Overall feels like he is doing better than he was a couple of years ago though does note that he is not quite back to himself.  Seems to be more blunted and withdrawn.  Does not respond to questions as he did previously.  He is no longer taking his losartan.  He is not monitoring his blood pressure at home.  He is currently referring lacrosse and football games locally.  Also works as a Lawyer intermittently.  Lifestyle Diet: Balanced. Plenty of fruits and vegetables.  Exercise: Gets plenty of cardiology.     07/22/2019    2:21 PM  Depression screen PHQ 2/9  Decreased Interest 0  Down, Depressed, Hopeless 0  PHQ - 2 Score 0    There are no preventive care reminders to display for this patient.   ROS: Per HPI, otherwise a complete review of systems was negative.   PMH:  The following were reviewed and entered/updated in epic: Past  Medical History:  Diagnosis Date   Family history of adverse reaction to anesthesia    Father: Difficult to awaken.   Hypertension    Patient Active Problem List   Diagnosis Date Noted   Schizophrenia (HCC) 11/05/2022   Family history of BRCA1 gene positive 11/05/2022   Thrombocytosis 07/28/2019   Essential hypertension 05/31/2019   UARS  (upper airway resistance syndrome) 08/21/2018   Recurrent dislocation of left patella 08/30/2016   Chondromalacia patellae, left knee 08/30/2016   Past Surgical History:  Procedure Laterality Date   Left knee surgery  12/2017   OPEN REDUCTION INTERNAL FIXATION (ORIF) METACARPAL Left 08/12/2015   Procedure: OPEN REDUCTION INTERNAL FIXATION (ORIF) LEFT 5th METACARPAL PHALANGEAL FRACTURE;  Surgeon: Dominica Severin, MD;  Location: [facility];  Service: Orthopedics;  Laterality: Left;   TONSILLECTOMY      Family History  Problem Relation Age of Onset   Hypertension Father    Hypertension Paternal Uncle    Asthma Maternal Grandmother    Birth defects Maternal Grandmother    Diabetes Maternal Grandmother    Hyperlipidemia Maternal Grandmother    Hypertension Maternal Grandmother    Birth defects Maternal Grandfather    Diabetes Maternal Grandfather    Hyperlipidemia Maternal Grandfather    Hypertension Maternal Grandfather    Birth defects Paternal Grandmother    Heart disease Paternal Grandfather    Hypertension Paternal Grandfather     Medications- reviewed and updated Current Outpatient Medications  Medication Sig Dispense Refill   losartan (COZAAR) 100 MG tablet Take 1 tablet (100 mg total) by mouth daily. 90 tablet 1   No current facility-administered medications for this visit.    Allergies-reviewed and updated No Known Allergies  Social History   Socioeconomic History   Marital status: Single    Spouse name: Not on file   Number of children: Not on file   Years of education: Not on file   Highest education level: Not on file  Occupational History   Not on file  Tobacco Use   Smoking status: Never   Smokeless tobacco: Never  Vaping Use   Vaping status: Never Used  Substance and Sexual Activity   Alcohol use: Yes    Comment: Drinks rarely   Drug use: No   Sexual activity: Never  Other Topics Concern   Not on file  Social History Narrative   Not on file    Social Determinants of Health   Financial Resource Strain: Not on file  Food Insecurity: Not on file  Transportation Needs: Not on file  Physical Activity: Not on file  Stress: Not on file  Social Connections: Unknown (03/13/2022)   Received from [facility]   Social Network    Social Network: Not on file        Objective:  Physical Exam: BP (!) 160/98   Pulse 82   Temp 98 F (36.7 C) (Temporal)   Ht 5\' 11"  (1.803 m)   Wt 241 lb 3.2 oz (109.4 kg)   SpO2 99%   BMI 33.64 kg/m   Body mass index is 33.64 kg/m. Wt Readings from Last 3 Encounters:  11/05/22 241 lb 3.2 oz (109.4 kg)  02/09/20 245 lb 11.2 oz (111.4 kg)  01/26/20 249 lb (112.9 kg)   Gen: NAD, resting comfortably yet HEENT: TMs normal bilaterally. OP clear. No thyromegaly noted.  CV: RRR with no murmurs appreciated Pulm: NWOB, CTAB with no crackles, wheezes, or rhonchi GI: Normal bowel sounds present. Soft, Nontender, Nondistended. MSK: no  edema, cyanosis, or clubbing noted Skin: warm, dry Neuro: CN2-12 grossly intact. Strength 5/5 in upper and lower extremities. Reflexes symmetric and intact bilaterally.  Psych: Blunted affect. No apparent AVH.      Katina Degree. Jimmey Ralph, MD 11/05/2022 10:59 AM

## 2022-11-05 NOTE — Assessment & Plan Note (Signed)
Mother diagnosed with BRCA1 positive breast cancer.  She wishes for him to get genetic counseling and genetic testing.  Will place referral today.

## 2022-11-05 NOTE — Patient Instructions (Addendum)
It was very nice to see you today!  Please restart the losartan.  Will check labs today.  Please continue to work on diet and exercise.  I will refer you to see the psychiatrist.  Will refer you for genetic counseling.  Will check blood work today.  Return in about 1 year (around 11/05/2023) for Annual Physical.   Take care, Dr Jimmey Ralph  PLEASE NOTE:  If you had any lab tests, please let us know if you have not heard back within a few days. You may see your results on mychart before we have a chance to review them but we will give you a call once they are reviewed by [place].   If we ordered any referrals today, please let us know if you have not heard from their office within the next week.   If you had any urgent prescriptions sent in today, please check with the pharmacy within an hour of our visit to make sure the prescription was transmitted appropriately.   Please try these tips to maintain a healthy lifestyle:  Eat at least 3 REAL meals and 1-2 snacks per day.  Aim for no more than 5 hours between eating.  If you eat breakfast, please do so within one hour of getting up.   Each meal should contain half fruits/vegetables, one quarter protein, and one quarter carbs (no bigger than a computer mouse)  Cut down on sweet beverages. This includes juice, soda, and sweet tea.   Drink at least 1 glass of water with each meal and aim for at least 8 glasses per day  Exercise at least 150 minutes every week.    Preventive Care 51-59 Years Old, Male Preventive care refers to lifestyle choices and visits with your health care provider that can promote health and wellness. Preventive care visits are also called wellness exams. What can I expect for my preventive care visit? Counseling During your preventive care visit, your health care provider may ask about your: Medical history, including: Past medical problems. Family medical history. Current health, including: Emotional  well-being. Home life and relationship well-being. Sexual activity. Lifestyle, including: Alcohol, nicotine or tobacco, and drug use. Access to firearms. Diet, exercise, and sleep habits. Safety issues such as seatbelt and bike helmet use. Sunscreen use. Work and work Astronomer. Physical exam Your health care provider may check your: Height and weight. These may be used to calculate your BMI (body mass index). BMI is a measurement that tells if you are at a healthy weight. Waist circumference. This measures the distance around your waistline. This measurement also tells if you are at a healthy weight and may help predict your risk of certain diseases, such as type 2 diabetes and high blood pressure. Heart rate and blood pressure. Body temperature. Skin for abnormal spots. What immunizations do I need?  Vaccines are usually given at various ages, according to a schedule. Your health care provider will recommend vaccines for you based on your age, medical history, and lifestyle or other factors, such as travel or where you work. What tests do I need? Screening Your health care provider may recommend screening tests for certain conditions. This may include: Lipid and cholesterol levels. Diabetes screening. This is done by checking your blood sugar (glucose) after you have not eaten for a while (fasting). Hepatitis B test. Hepatitis C test. HIV (human immunodeficiency virus) test. STI (sexually transmitted infection) testing, if you are at risk. Talk with your health care provider about your test results, treatment  options, and if necessary, the need for more tests. Follow these instructions at home: Eating and drinking  Eat a healthy diet that includes fresh fruits and vegetables, whole grains, lean protein, and low-fat dairy products. Drink enough fluid to keep your urine pale yellow. Take vitamin and mineral supplements as recommended by your health care provider. Do not drink  alcohol if your health care provider tells you not to drink. If you drink alcohol: Limit how much you have to 0-2 drinks a day. Know how much alcohol is in your drink. In the [place] one drink equals one 12 oz bottle of beer (355 mL), one 5 oz glass of wine (148 mL), or one 1 oz glass of hard liquor (44 mL). Lifestyle Brush your teeth every morning and night with fluoride toothpaste. Floss one time each day. Exercise for at least 30 minutes 5 or more days each week. Do not use any products that contain nicotine or tobacco. These products include cigarettes, chewing tobacco, and vaping devices, such as e-cigarettes. If you need help quitting, ask your health care provider. Do not use drugs. If you are sexually active, practice safe sex. Use a condom or other form of protection to prevent STIs. Find healthy ways to manage stress, such as: Meditation, yoga, or listening to music. Journaling. Talking to a trusted person. Spending time with friends and family. Minimize exposure to UV radiation to reduce your risk of skin cancer. Safety Always wear your seat belt while driving or riding in a vehicle. Do not drive: If you have been drinking alcohol. Do not ride with someone who has been drinking. If you have been using any mind-altering substances or drugs. While texting. When you are tired or distracted. Wear a helmet and other protective equipment during sports activities. If you have firearms in your house, make sure you follow all gun safety procedures. Seek help if you have been physically or sexually abused. What's next? Go to your health care provider once a year for an annual wellness visit. Ask your health care provider how often you should have your eyes and teeth checked. Stay up to date on all vaccines. This information is not intended to replace advice given to you by your health care provider. Make sure you discuss any questions you have with your health care provider. Document  Revised: 06/28/2020 Document Reviewed: 06/28/2020 Elsevier Patient Education  2024 [employer].

## 2022-11-05 NOTE — Assessment & Plan Note (Signed)
Overall symptoms are stable.  He is somewhat blunted on exam today however no apparent AVH.  No reported SI or HI.  He is not currently on any medications.  He is agreeable to establish care with psychiatrist in the area.  Place referral today.

## 2022-11-05 NOTE — Assessment & Plan Note (Signed)
Blood pressure elevated today.  Is been off blood pressure medications.  Will restart losartan 100 mg daily.  Tolerated this well previously.  Check labs today.  He will follow-up with [place] in a few weeks via MyChart and let us know if persistently elevated at home.  He was previously on amlodipine however some leg edema with this.  Will not continue this at this point.  If he needs additional therapy beyond losartan 100 mg daily will try HCTZ.

## 2022-11-07 ENCOUNTER — Telehealth: Payer: Self-pay | Admitting: Genetic Counselor

## 2022-11-07 NOTE — Telephone Encounter (Signed)
Called and left VM for patient to call back to schedule an appointment.

## 2022-11-07 NOTE — Progress Notes (Signed)
His cholesterol is mildly elevated.  Do not need to start meds but he should work on diet and exercise and we can recheck this in a year.  One of his liver numbers was slightly elevated.  We should have him come back in 1 to 2 weeks to make sure this is stable.  Please place future order for CMET.   Rest of his labs are all stable and we can recheck in a year.

## 2022-11-08 ENCOUNTER — Other Ambulatory Visit: Payer: Self-pay | Admitting: *Deleted

## 2022-11-08 DIAGNOSIS — R748 Abnormal levels of other serum enzymes: Secondary | ICD-10-CM

## 2022-11-20 ENCOUNTER — Other Ambulatory Visit: Payer: Self-pay | Admitting: *Deleted

## 2022-11-20 DIAGNOSIS — R748 Abnormal levels of other serum enzymes: Secondary | ICD-10-CM

## 2022-12-03 ENCOUNTER — Inpatient Hospital Stay: Payer: No Typology Code available for payment source | Attending: Internal Medicine | Admitting: Genetic Counselor

## 2022-12-03 ENCOUNTER — Other Ambulatory Visit: Payer: Self-pay | Admitting: Genetic Counselor

## 2022-12-03 ENCOUNTER — Inpatient Hospital Stay: Payer: No Typology Code available for payment source

## 2022-12-03 DIAGNOSIS — Z803 Family history of malignant neoplasm of breast: Secondary | ICD-10-CM

## 2022-12-03 DIAGNOSIS — Z1379 Encounter for other screening for genetic and chromosomal anomalies: Secondary | ICD-10-CM

## 2022-12-03 DIAGNOSIS — Z8481 Family history of carrier of genetic disease: Secondary | ICD-10-CM | POA: Diagnosis not present

## 2022-12-03 LAB — GENETIC SCREENING ORDER

## 2022-12-04 ENCOUNTER — Encounter: Payer: Self-pay | Admitting: Genetic Counselor

## 2022-12-04 NOTE — Progress Notes (Signed)
REFERRING PROVIDER: Ardith Dark, MD [address] [place],  [place] [address]  PRIMARY PROVIDER:  Ardith Dark, MD  PRIMARY REASON FOR VISIT:  1. Family history of BRCA1 gene positive   2. Family history of breast cancer    HISTORY OF PRESENT ILLNESS:   Christian Villegas, a 27 y.o. male, was seen for a [place] cancer genetics consultation at the request of Dr. Jimmey Ralph due to a family history of a BRCA1 gene mutation in his mother and sister.  [name] presents to clinic today to discuss the possibility of a hereditary predisposition to cancer, to discuss genetic testing, and to further clarify his future cancer risks, as well as potential cancer risks for family members.   [name] is a 26 y.o. male with no personal history of cancer.    Past Medical History:  Diagnosis Date   Family history of adverse reaction to anesthesia    Father: Difficult to awaken.   Hypertension     Past Surgical History:  Procedure Laterality Date   Left knee surgery  12/2017   OPEN REDUCTION INTERNAL FIXATION (ORIF) METACARPAL Left 08/12/2015   Procedure: OPEN REDUCTION INTERNAL FIXATION (ORIF) LEFT 5th METACARPAL PHALANGEAL FRACTURE;  Surgeon: Dominica Severin, MD;  Location: [facility] OR;  Service: Orthopedics;  Laterality: Left;   TONSILLECTOMY      Social History   Socioeconomic History   Marital status: Single    Spouse name: Not on file   Number of children: Not on file   Years of education: Not on file   Highest education level: Not on file  Occupational History   Not on file  Tobacco Use   Smoking status: Never   Smokeless tobacco: Never  Vaping Use   Vaping status: Never Used  Substance and Sexual Activity   Alcohol use: Yes    Comment: Drinks rarely   Drug use: No   Sexual activity: Never  Other Topics Concern   Not on file  Social History Narrative   Not on file   Social Determinants of Health   Financial Resource Strain: Not on file  Food Insecurity: Not on file  Transportation  Needs: Not on file  Physical Activity: Not on file  Stress: Not on file  Social Connections: Unknown (03/13/2022)   Received from [facility]   Social Network    Social Network: Not on file     FAMILY HISTORY:  We obtained a detailed, 4-generation family history.  Significant diagnoses are listed below: Family History  Problem Relation Age of Onset   Breast cancer Mother 9       BRCA1+   Other Sister        BRCA1+   Breast cancer Maternal Grandmother    Ovarian cancer Maternal Grandmother    Melanoma Maternal Grandfather    Ovarian cancer Paternal Grandmother 82   Breast cancer Paternal Grandmother 44      [name]'s mother was diagnosed with breast cancer at age 20, she had genetic testing through [facility] and was found to have a BRCA1 gene mutation (c. [phone]), result was confirmed. His sister also had genetic testing through [facility] and was found to have the same BRCA1 gene mutation (c. [id number]) and a BARD1 variant of uncertain significance (c.[id number]), result was confirmed. His maternal aunt also reportedly has the familial BRCA1 gene mutation. His maternal grandmother had breast and ovarian cancer and his maternal grandfather had melanoma. [name] paternal uncle was diagnosed with lymphoma at age 60.  His paternal grandmother was diagnosed with ovarian cancer at age 75 and breast cancer at age 75, she died at age 16. His father recently pursued genetic testing and his results are pending. There is no reported Ashkenazi Jewish ancestry.   GENETIC COUNSELING ASSESSMENT: [name] is a 27 y.o. male with a family history of a BRCA1 gene mutation. We, therefore, discussed and recommended the following at today's visit.   DISCUSSION:  We reviewed the cancer risks and management recommendations for individuals with a BRCA1 gene mutation with a focus on management in males.   The cancers associated with BRCA1 are: Male breast cancer, up to a 78% risk Male breast  cancer, up to a 2% risk Ovarian cancer, 39-58% risk Pancreatic cancer, 3-5% risk Prostate cancer, 7-26% risk Limited data suggest there may be a slightly increased risk of serous uterine cancer   Management Recommendations:  Breast Screening/Risk Reduction:  Males: Breast self-exam training and education starting at age 62 years Annual clinical breast exam starting at age 65 years  Consider annual mammogram starting at age 60 or 10 years before the earliest known male breast cancer in the family (whichever comes first).    Prostate Cancer Screening: Consider annual digital rectal exam (DRE) at age 16 Consider annual PSA blood test at age 58  Pancreatic Cancer Screening/Risk Reduction: Avoid smoking, heavy alcohol use, and obesity. It has been suggested that pancreatic cancer screening be limited to those with a family history of pancreatic cancer (first- or second-degree relative). Ideally, screening should be performed in experienced centers utilizing a multidisciplinary approach under research conditions. Recommended screening could include annual endoscopic ultrasound (preferred) and/or MRI of the pancreas starting at age 58 or 50 years younger than the earliest age diagnosis in the family.  Implications for Family Members: Hereditary predisposition to cancer due to pathogenic variants in the BRCA1 gene has autosomal dominant inheritance. This means that an individual with a pathogenic variant has a 50% chance of passing the condition on to his/her offspring. Identification of a pathogenic variant allows for the recognition of at-risk relatives who can pursue testing for the familial variant.  Additional Information: [name] was offered single site BRCA1 genetic testing and a gene panel. We reviewed the pros and cons of both options. We discussed that since his father proceeded with genetic testing (results are pending), it is reasonable to pursue BRCA1 single site testing and if his  father has positive testing, we can reflex to additional genes.   Based on [name] family history of a BRCA1 gene mutation and cancer, he meets medical criteria for genetic testing. Despite that he meets criteria, he may still have an out of pocket cost. We discussed that if his out of pocket cost for testing is over $100, the laboratory should contact them to discuss self-pay prices, patient pay assistance programs, if applicable, and other billing options.  We discussed that some people do not want to undergo genetic testing due to fear of genetic discrimination.  A federal law called the Genetic Information Non-Discrimination Act (GINA) of 2008 helps protect individuals against genetic discrimination based on their genetic test results.  It impacts both health insurance and employment.  With health insurance, it protects against increased premiums, being kicked off insurance or being forced to take a test in order to be insured.  For employment it protects against hiring, firing and promoting decisions based on genetic test results.  GINA does not apply to those in the [employer], those who  work for companies with less than 15 employees, and new life insurance or long-term disability Medical illustrator.  Health status due to a cancer diagnosis is not protected under GINA.  PLAN: After considering the risks, benefits, and limitations, Mr. Siemon provided informed consent to pursue genetic testing and the blood sample was sent to [facility] for analysis of the BRCA1 gene. If his father's genetic testing results are positive, we will reflex to additional gene(s). Results should be available within approximately 2-3 weeks' time, at which point they will be disclosed by telephone to Mr. Hudzik, as will any additional recommendations warranted by these results. Mr. Gladd will receive a summary of his genetic counseling visit and a copy of his results once available. This information will also be available  in Epic.   Mr. Mamone questions were answered to his satisfaction today. Our contact information was provided should additional questions or concerns arise. Thank you for the referral and allowing [place] to share in the care of your patient.   Lalla Brothers, MS, [facility] Genetic Counselor [email or web address] (P) [phone]  The patient was seen for a total of 30 minutes in face-to-face genetic counseling.  The patient brought his father. Drs. Pamelia Hoit and/or Mosetta Putt were available to discuss this case as needed.  _______________________________________________________________________ For Office Staff:  Number of people involved in session: 2 Was an Intern/ student involved with case: yes, Asher Muir

## 2022-12-27 ENCOUNTER — Encounter: Payer: Self-pay | Admitting: Genetic Counselor

## 2022-12-27 ENCOUNTER — Telehealth: Payer: Self-pay | Admitting: Genetic Counselor

## 2022-12-27 DIAGNOSIS — Z1379 Encounter for other screening for genetic and chromosomal anomalies: Secondary | ICD-10-CM | POA: Insufficient documentation

## 2022-12-27 NOTE — Telephone Encounter (Signed)
I contacted Mr. Wurdeman to discuss his genetic testing results. [facility] BRCA1 single site testing was Negative. He did NOT inherit the familial BRCA1 gene mutation. Detailed clinic note to follow.  The test report has been scanned into EPIC and is located under the Molecular Pathology section of the Results Review tab.  A portion of the result report is included below for reference.   Lalla Brothers, MS, [facility] Genetic Counselor [email or web address] (P) [phone]

## 2022-12-30 ENCOUNTER — Encounter: Payer: Self-pay | Admitting: Genetic Counselor

## 2022-12-30 ENCOUNTER — Ambulatory Visit: Payer: Self-pay | Admitting: Genetic Counselor

## 2022-12-30 DIAGNOSIS — Z1379 Encounter for other screening for genetic and chromosomal anomalies: Secondary | ICD-10-CM

## 2022-12-30 NOTE — Progress Notes (Signed)
HPI:   [name] was previously seen in the [facility] due to a family history of a BRCA1 gene mutation and concerns regarding a hereditary predisposition to cancer. Please refer to our prior cancer genetics clinic note for more information regarding our discussion, assessment and recommendations, at the time. [name] recent genetic test results were disclosed to him, as were recommendations warranted by these results. These results and recommendations are discussed in more detail below.  CANCER HISTORY:  Oncology History   No history exists.    FAMILY HISTORY:  We obtained a detailed, 4-generation family history.  Significant diagnoses are listed below: Family History  Problem Relation Age of Onset   Breast cancer Mother 45       BRCA1+   Other Father        Negative hereditary cancer genetic testing (76 genes)   Other Sister        BRCA1+   Breast cancer Maternal Grandmother    Ovarian cancer Maternal Grandmother    Melanoma Maternal Grandfather    Ovarian cancer Paternal Grandmother 30   Breast cancer Paternal Grandmother 72         [name]'s mother was diagnosed with breast cancer at age 69, she had genetic testing through [facility] and was found to have a BRCA1 gene mutation (c. [phone]), result was confirmed. His sister also had genetic testing through [facility] and was found to have the same BRCA1 gene mutation (c. [id number]) and a BARD1 variant of uncertain significance (c.776A>G), result was confirmed. His maternal aunt also reportedly has the familial BRCA1 gene mutation. His maternal grandmother had breast and ovarian cancer and his maternal grandfather had melanoma. [name] paternal uncle was diagnosed with lymphoma at age 70. His paternal grandmother was diagnosed with ovarian cancer at age 34 and breast cancer at age 38, she died at age 25. His father recently pursued genetic testing at [facility] and his results were Negative (76  genes). There is no reported Ashkenazi Jewish ancestry.   GENETIC TEST RESULTS:  The [facility] BRCA1 single site testing was Negative. He did NOT inherit the familial BRCA1 gene mutation.  The test report has been scanned into EPIC and is located under the Molecular Pathology section of the Results Review tab.  A portion of the result report is included below for reference. Genetic testing reported out on 12/24/2022.        ADDITIONAL GENETIC TESTING:  We discussed with [name] that since his father had negative genetic testing (76 genes), he does not need any additional genetic testing at this time.  CANCER SCREENING RECOMMENDATIONS:  [name] test was normal and did not reveal the familial BRCA1 gene mutation. We call this result a true negative result because the cancer-causing mutation was identified in [name] family, and he did not inherit it.  Given this negative result, [name] chances of developing BRCA1-related cancers are the same as they are in the general population.  Therefore, he is recommended to have general population cancer screenings.   RECOMMENDATIONS FOR FAMILY MEMBERS:   Other members of the family may still carry a pathogenic variant in one of these genes that [name] did not inherit. Based on the family history, we recommend his maternal family members have genetic counseling and testing.   FOLLOW-UP:  Cancer genetics is a rapidly advancing field and it is possible that new genetic tests will be appropriate for him and/or his family members in the  future. We encouraged him to remain in contact with cancer genetics on an annual basis so we can update his personal and family histories and let him know of advances in cancer genetics that may benefit this family.   Our contact number was provided. Christian Villegas questions were answered to his satisfaction, and he knows he is welcome to call us at anytime with additional questions or concerns.   Lalla Brothers,  MS, [facility] Genetic Counselor [email or web address] (P) [phone]

## 2023-02-17 ENCOUNTER — Ambulatory Visit: Payer: No Typology Code available for payment source | Admitting: Family Medicine

## 2023-02-17 VITALS — BP 160/84 | HR 84 | Temp 97.1°F | Ht 71.0 in | Wt 245.6 lb

## 2023-02-17 DIAGNOSIS — M79602 Pain in left arm: Secondary | ICD-10-CM | POA: Diagnosis not present

## 2023-02-17 DIAGNOSIS — I10 Essential (primary) hypertension: Secondary | ICD-10-CM

## 2023-02-17 DIAGNOSIS — F209 Schizophrenia, unspecified: Secondary | ICD-10-CM

## 2023-02-17 MED ORDER — DICLOFENAC SODIUM 75 MG PO TBEC: 75.0000 mg | DELAYED_RELEASE_TABLET | Freq: Two times a day (BID) | ORAL | 0 refills | Status: DC

## 2023-02-17 MED ORDER — PALIPERIDONE ER 9 MG PO TB24: 9.0000 mg | ORAL_TABLET | ORAL | 0 refills | Status: DC

## 2023-02-17 NOTE — Assessment & Plan Note (Signed)
Blood pressure elevated today.  Recently just restarted losartan several days ago.  He will continue to monitor at home and let us know if persistently elevated.

## 2023-02-17 NOTE — Progress Notes (Signed)
   Christian Villegas is a 28 y.o. male who presents today for an office visit.  Assessment/Plan:  New/Acute Problems: Left Arm Pain  Exam consistent with cervical radiculopathy.  No focal loss of strength or sensation.  Given length of symptoms we did discuss referral to sports medicine for further evaluation however he would like to hold off on this for now.  We discussed home exercise program and handout was given.  Also start diclofenac.  He will let us know if not proving in the next 1 to 2 weeks and we can refer to PT or sports medicine.  Chronic Problems Addressed Today: Essential hypertension Blood pressure elevated today.  Recently just restarted losartan several days ago.  He will continue to monitor at home and let us know if persistently elevated.  Schizophrenia (HCC) Overall symptoms are currently stable.  No apparent AVH on exam.  No reported SI or HI.  We did refer him to see psychiatrist a few months ago however he has not yet heard back from his referral.  We gave contact information restriction for him to call to schedule appointment soon.  Will give a emergency refill for his paliperidone 9 mg daily.  Did discuss that this is for short-term use only and at he needs to follow-up with psychiatry for ongoing management for schizophrenia.  He voiced understanding.     Subjective:  HPI:  See Assessment / plan for status of chronic conditions.  His main concern is left arm numbness and pain. Started about 6 months. No obvious injuries or precipitating events. Tried meloxicam without much improvement. Symptoms are getting worse.  Worse with certain motions.  Symptoms gets numbness and tingling in his hand.  Last saw him a few months ago for his annual physical.  At that time he was reestablishing care after being in [place] for a few years.  We had referred him to see a psychiatrist for management for his schizophrenia however he has not yet heard from them.  He is currently on  paliperidone and will be running out of prescription soon.  He would like for [place] to take this over.        Objective:  Physical Exam: BP (!) 160/84   Pulse 84   Temp (!) 97.1 F (36.2 C) (Temporal)   Ht 5\' 11"  (1.803 m)   Wt 245 lb 9.6 oz (111.4 kg)   SpO2 97%   BMI 34.25 kg/m   Gen: No acute distress, resting comfortably CV: Regular rate and rhythm with no murmurs appreciated Pulm: Normal work of breathing, clear to auscultation bilaterally with no crackles, wheezes, or rhonchi MUSCULOSKELETAL: - Neck: No deformities.  Full range of motion.  Spurling negative - Left Arm: No deformities.  Full range of motion.  Neurovascularly intact distally.  Tinel sign negative at left wrist and medial epicondyle. Neuro: Grossly normal, moves all extremities Psych: Normal affect and thought content      Taraoluwa Thakur M. Jimmey Ralph, MD 02/17/2023 3:08 PM

## 2023-02-17 NOTE — Assessment & Plan Note (Signed)
Overall symptoms are currently stable.  No apparent AVH on exam.  No reported SI or HI.  We did refer him to see psychiatrist a few months ago however he has not yet heard back from his referral.  We gave contact information restriction for him to call to schedule appointment soon.  Will give a emergency refill for his paliperidone 9 mg daily.  Did discuss that this is for short-term use only and at he needs to follow-up with psychiatry for ongoing management for schizophrenia.  He voiced understanding.

## 2023-02-17 NOTE — Patient Instructions (Addendum)
It was very nice to see you today!  You have a pinched nerve in your neck.  Please work on the exercises.  Start the diclofenac.  I will refill your paliperidone as well.  Please call to schedule appoint with a psychiatrist soon  Sent to [place] Address: [address] [place] [address] Phone: [phone] Fax: [phone]  Return if symptoms worsen or fail to improve.   Take care, Dr Jimmey Ralph  PLEASE NOTE:  If you had any lab tests, please let us know if you have not heard back within a few days. You may see your results on mychart before we have a chance to review them but we will give you a call once they are reviewed by [place].   If we ordered any referrals today, please let us know if you have not heard from their office within the next week.   If you had any urgent prescriptions sent in today, please check with the pharmacy within an hour of our visit to make sure the prescription was transmitted appropriately.   Please try these tips to maintain a healthy lifestyle:  Eat at least 3 REAL meals and 1-2 snacks per day.  Aim for no more than 5 hours between eating.  If you eat breakfast, please do so within one hour of getting up.   Each meal should contain half fruits/vegetables, one quarter protein, and one quarter carbs (no bigger than a computer mouse)  Cut down on sweet beverages. This includes juice, soda, and sweet tea.   Drink at least 1 glass of water with each meal and aim for at least 8 glasses per day  Exercise at least 150 minutes every week.

## 2023-03-13 ENCOUNTER — Other Ambulatory Visit: Payer: Self-pay | Admitting: Family Medicine

## 2023-03-31 ENCOUNTER — Encounter: Payer: Self-pay | Admitting: Family Medicine

## 2023-03-31 ENCOUNTER — Ambulatory Visit (INDEPENDENT_AMBULATORY_CARE_PROVIDER_SITE_OTHER)

## 2023-03-31 ENCOUNTER — Ambulatory Visit: Admitting: Family Medicine

## 2023-03-31 VITALS — BP 128/78 | HR 72 | Temp 98.4°F | Ht 71.0 in | Wt 252.2 lb

## 2023-03-31 DIAGNOSIS — I10 Essential (primary) hypertension: Secondary | ICD-10-CM | POA: Diagnosis not present

## 2023-03-31 DIAGNOSIS — M25512 Pain in left shoulder: Secondary | ICD-10-CM

## 2023-03-31 DIAGNOSIS — M79604 Pain in right leg: Secondary | ICD-10-CM | POA: Diagnosis not present

## 2023-03-31 MED ORDER — DICLOFENAC SODIUM 75 MG PO TBEC: 75.0000 mg | DELAYED_RELEASE_TABLET | Freq: Two times a day (BID) | ORAL | 0 refills | Status: AC

## 2023-03-31 NOTE — Assessment & Plan Note (Signed)
 At goal today on losartan 100 mg daily.  He will monitor at home and let us know if persistently elevated.
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
## 2023-04-01 ENCOUNTER — Encounter: Payer: Self-pay | Admitting: Family Medicine

## 2023-04-01 NOTE — Progress Notes (Signed)
 This x-ray is normal.  Recommend he follow-up with sports medicine soon as we discussed at his office visit.

## 2023-04-11 ENCOUNTER — Ambulatory Visit (INDEPENDENT_AMBULATORY_CARE_PROVIDER_SITE_OTHER): Payer: Self-pay | Admitting: Behavioral Health

## 2023-04-11 DIAGNOSIS — F209 Schizophrenia, unspecified: Secondary | ICD-10-CM
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
## 2023-04-14 ENCOUNTER — Encounter: Payer: Self-pay | Admitting: Behavioral Health
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
## 2023-04-21 ENCOUNTER — Other Ambulatory Visit: Payer: Self-pay | Admitting: *Deleted

## 2023-04-21 MED ORDER — PALIPERIDONE ER 9 MG PO TB24: 9.0000 mg | ORAL_TABLET | Freq: Every morning | ORAL | 0 refills | Status: DC

## 2023-04-23 ENCOUNTER — Other Ambulatory Visit: Payer: Self-pay | Admitting: *Deleted

## 2023-04-23 MED ORDER — PALIPERIDONE ER 9 MG PO TB24: 9.0000 mg | ORAL_TABLET | Freq: Every morning | ORAL | 0 refills | Status: DC

## 2023-04-23 NOTE — Progress Notes (Unsigned)
   Rubin Payor, PhD, LAT, ATC acting as a scribe for Clementeen Graham, MD.  Christian Villegas is a 28 y.o. male who presents to [facility] today for L shoulder pain  Radiates: Aggravates: Treatments tried: oral diclofenac,   Dx imaging: 03/31/23 L shoulder XR  Pertinent review of systems: ***  Relevant historical information: ***   Exam:  There were no vitals taken for this visit. General: Well Developed, well nourished, and in no acute distress.   MSK: ***    Lab and Radiology Results No results found for this or any previous visit (from the past 72 hours). No results found.     Assessment and Plan: 28 y.o. male with ***   PDMP not reviewed this encounter. No orders of the defined types were placed in this encounter.  No orders of the defined types were placed in this encounter.    Discussed warning signs or symptoms. Please see discharge instructions. Patient expresses understanding.   ***

## 2023-04-24 ENCOUNTER — Other Ambulatory Visit: Payer: Self-pay

## 2023-04-24 ENCOUNTER — Ambulatory Visit: Admitting: Family Medicine

## 2023-04-24 VITALS — BP 146/90 | HR 96 | Ht 71.0 in | Wt 257.0 lb

## 2023-04-24 DIAGNOSIS — G8929 Other chronic pain: Secondary | ICD-10-CM

## 2023-04-24 DIAGNOSIS — M25512 Pain in left shoulder: Secondary | ICD-10-CM | POA: Diagnosis not present

## 2023-04-24 NOTE — Patient Instructions (Addendum)
 Thank you for coming in today.   I've referred you to Physical Therapy.  Let us know if you don't hear from them in one week.   Check back in 8 weeks  Let me know if this is not working

## 2023-05-01 ENCOUNTER — Encounter: Payer: Self-pay | Admitting: Behavioral Health

## 2023-05-01 ENCOUNTER — Ambulatory Visit (INDEPENDENT_AMBULATORY_CARE_PROVIDER_SITE_OTHER): Payer: Self-pay | Admitting: Behavioral Health

## 2023-05-01 DIAGNOSIS — F209 Schizophrenia, unspecified: Secondary | ICD-10-CM | POA: Diagnosis not present

## 2023-05-01 MED ORDER — PALIPERIDONE ER 9 MG PO TB24: 9.0000 mg | ORAL_TABLET | Freq: Every morning | ORAL | 0 refills | Status: AC
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
## 2023-06-18 NOTE — Progress Notes (Unsigned)
   Joanna Muck, PhD, LAT, ATC acting as a scribe for Christian Juniper, MD.  Christian Villegas is a 28 y.o. male who presents to [facility] today for f/u L shoulder pain. Pt was last seen by Dr. Alease Hunter on 04/24/23 and was referred to PT, but never scheduled any visits.  Today, pt reports ***  Dx imaging: 03/31/23 L shoulder XR   Pertinent review of systems: ***  Relevant historical information: ***   Exam:  There were no vitals taken for this visit. General: Well Developed, well nourished, and in no acute distress.   MSK: ***    Lab and Radiology Results No results found for this or any previous visit (from the past 72 hours). No results found.     Assessment and Plan: 28 y.o. male with ***   PDMP not reviewed this encounter. No orders of the defined types were placed in this encounter.  No orders of the defined types were placed in this encounter.    Discussed warning signs or symptoms. Please see discharge instructions. Patient expresses understanding.   ***

## 2023-06-19 ENCOUNTER — Ambulatory Visit: Admitting: Family Medicine

## 2023-06-19 ENCOUNTER — Encounter: Payer: Self-pay | Admitting: Family Medicine

## 2023-06-19 ENCOUNTER — Other Ambulatory Visit: Payer: Self-pay

## 2023-06-19 VITALS — HR 79 | Ht 71.0 in | Wt 257.0 lb

## 2023-06-19 DIAGNOSIS — M25561 Pain in right knee: Secondary | ICD-10-CM | POA: Diagnosis not present

## 2023-06-19 DIAGNOSIS — S76111S Strain of right quadriceps muscle, fascia and tendon, sequela: Secondary | ICD-10-CM

## 2023-06-19 DIAGNOSIS — S76119A Strain of unspecified quadriceps muscle, fascia and tendon, initial encounter: Secondary | ICD-10-CM | POA: Insufficient documentation

## 2023-06-19 NOTE — Patient Instructions (Addendum)
 Thank you for coming in today.   Please work on the home exercises the athletic trainer went over with you:  View at [email or web address] code [email or web address]  I recommend you obtained a compression sleeve to help with your joint problems. There are many options on the market however I recommend obtaining a Thigh Sleeve Body Helix compression sleeve.  You can find information (including how to appropriate measure yourself for sizing) can be found at www.[email or web address].ch.  Many of these products are health savings account (HSA) eligible.   You can use the compression sleeve at any time throughout the day but is most important to use while being active as well as for 2 hours post-activity.   It is appropriate to ice following activity with the compression sleeve in place.

## 2023-06-26 ENCOUNTER — Ambulatory Visit: Payer: Self-pay | Admitting: Behavioral Health

## 2023-06-26 DIAGNOSIS — Z0389 Encounter for observation for other suspected diseases and conditions ruled out: Secondary | ICD-10-CM

## 2023-06-26 NOTE — Progress Notes (Signed)
 Pt did not show for scheduled visit and did not provide 24 hour notice as required. Additional fees to be assessed.

## 2023-11-07 ENCOUNTER — Encounter: Payer: No Typology Code available for payment source | Admitting: Family Medicine

## 2023-11-10 ENCOUNTER — Encounter: Payer: Self-pay | Admitting: Family Medicine
